# Patient Record
Sex: Female | Born: 1940 | Hispanic: No | Smoking: Current every day smoker
Health system: Southern US, Community
[De-identification: ages and names within clinical notes are randomized; demographics above are authoritative.]

## PROBLEM LIST (undated history)

## (undated) DIAGNOSIS — F411 Generalized anxiety disorder: Secondary | ICD-10-CM

## (undated) DIAGNOSIS — E785 Hyperlipidemia, unspecified: Secondary | ICD-10-CM

## (undated) DIAGNOSIS — F32A Depression, unspecified: Secondary | ICD-10-CM

## (undated) DIAGNOSIS — E079 Disorder of thyroid, unspecified: Secondary | ICD-10-CM

## (undated) DIAGNOSIS — F329 Major depressive disorder, single episode, unspecified: Secondary | ICD-10-CM

## (undated) DIAGNOSIS — I10 Essential (primary) hypertension: Secondary | ICD-10-CM

## (undated) HISTORY — DX: Essential (primary) hypertension: I10

## (undated) HISTORY — DX: Generalized anxiety disorder: F41.1

## (undated) HISTORY — DX: Disorder of thyroid, unspecified: E07.9

## (undated) HISTORY — DX: Hyperlipidemia, unspecified: E78.5

## (undated) HISTORY — DX: Major depressive disorder, single episode, unspecified: F32.9

## (undated) HISTORY — DX: Depression, unspecified: F32.A

---

## 1968-04-08 HISTORY — PX: EYE SURGERY: SHX253

## 2006-05-28 ENCOUNTER — Other Ambulatory Visit: Admission: RE | Admit: 2006-05-28 | Discharge: 2006-05-28 | Payer: Self-pay | Admitting: Family Medicine

## 2012-02-26 LAB — BASIC METABOLIC PANEL
BUN: 11 mg/dL (ref 4–21)
Creatinine: 0.8 mg/dL (ref 0.5–1.1)
Glucose: 94 mg/dL
Potassium: 3.9 mmol/L (ref 3.4–5.3)
Sodium: 140 mmol/L (ref 137–147)

## 2012-02-26 LAB — HEPATIC FUNCTION PANEL
ALT: 10 U/L (ref 7–35)
AST: 15 U/L (ref 13–35)
Bilirubin, Total: 0.7 mg/dL

## 2012-02-26 LAB — TSH: TSH: 1.81 u[IU]/mL (ref 0.41–5.90)

## 2012-06-16 ENCOUNTER — Other Ambulatory Visit: Payer: Self-pay | Admitting: Nurse Practitioner

## 2012-06-24 ENCOUNTER — Ambulatory Visit: Payer: Medicare Other

## 2012-06-24 ENCOUNTER — Ambulatory Visit (INDEPENDENT_AMBULATORY_CARE_PROVIDER_SITE_OTHER): Payer: Medicare Other | Admitting: Pharmacist

## 2012-06-24 ENCOUNTER — Encounter: Payer: Self-pay | Admitting: Pharmacist

## 2012-06-24 VITALS — Ht 64.0 in | Wt 150.0 lb

## 2012-06-24 DIAGNOSIS — M810 Age-related osteoporosis without current pathological fracture: Secondary | ICD-10-CM

## 2012-06-24 MED ORDER — ALENDRONATE SODIUM 70 MG PO TABS: 70.0000 mg | ORAL_TABLET | ORAL | Status: DC

## 2012-06-24 NOTE — Patient Instructions (Signed)

## 2012-06-24 NOTE — Progress Notes (Signed)
[facility] Current Height: Height: 5\' 4"  (162.6 cm)      Max Lifetime Height:  5'6" Current Weight: Weight: 150 lb (68.04 kg)     Max Lifetime Weight:  168 lbs  Ethnicity: Not Hispanic or Latino [1] BP:       HR:         HPI: Does pt already have a diagnosis of:  Osteopenia?  No Osteoporosis?  No  Back Pain?  Yes       Kyphosis?  Yes Prior fracture?  No Med(s) for Osteoporosis/Osteopenia:  Calcium 1200mg  daily, vitamin D 1000iu daily Med(s) previously tried for Osteoporosis/Osteopenia:  none                                                             PMH: Age at menopause:  72yo Hysterectomy?  No Oophorectomy?  No HRT? No Steroid Use?  No Thyroid med?  Yes History of cancer?  No History of digestive disorders (ie Crohn's)?  No Current or previous eating disorders?  No Last Vitamin D Result:  28 (09/2011) Last GFR Result:  68 (02/2012)   FH/SH: Family history of osteoporosis? Yes, niece Parent with history of hip fracture?  Yes, father in his 33's Family history of breast cancer?  Yes, niece Exercise?  Yes occassionaly Caffeine?  Yes, coffee 1 cup per day Smoking?  Yes   Alcohol?  No    Calcium Assessment Calcium Intake  # of servings/day  Calcium mg  Milk (8 oz) 0.5  x  300  = 150  Yogurt (4 oz) 0.5 x  200 = 100  Cheese (1 oz) 0 x  200 = 0  Other Calcium sources     Ca supplement 1200/day = 1200   Estimated calcium intake per day 1450mg     DEXA Results Date of Test T-Score for AP Spine L1-L4 T-Score for Total Left Hip T-Score for Total Right Hip  06/24/2012 -2.8 -2.7 -3.0                  FRAX 10 year estimate:  Assessment: osteoporosis  Recommendations: Start alendronate 70mg  1 tablet by mouth weekly.  Take on an empty stomach, with full glass of water.  Nothing to eat, no meds for 30 minutes after taking alendronate.  Do not lie down or recline for 30 minutes after taking alendronate recommend weight bearing exercise - 30 minutes at least 4  days per week.   Counseled and educated about fall risk and prevention Recheck DEXA:  2 years  Time spent counseling patient:  30 minutes

## 2012-09-21 ENCOUNTER — Other Ambulatory Visit: Payer: Self-pay | Admitting: *Deleted

## 2012-09-21 MED ORDER — LISINOPRIL 40 MG PO TABS: 40.0000 mg | ORAL_TABLET | Freq: Every day | ORAL | Status: DC

## 2012-09-21 MED ORDER — METOPROLOL SUCCINATE ER 100 MG PO TB24: 100.0000 mg | ORAL_TABLET | Freq: Every day | ORAL | Status: DC

## 2012-09-21 MED ORDER — LEVOTHYROXINE SODIUM 75 MCG PO TABS: 75.0000 ug | ORAL_TABLET | Freq: Every day | ORAL | Status: DC

## 2012-09-21 MED ORDER — HYDROCHLOROTHIAZIDE 25 MG PO TABS: 25.0000 mg | ORAL_TABLET | Freq: Every day | ORAL | Status: DC

## 2012-09-23 ENCOUNTER — Encounter: Payer: Self-pay | Admitting: Nurse Practitioner

## 2012-09-23 ENCOUNTER — Ambulatory Visit (INDEPENDENT_AMBULATORY_CARE_PROVIDER_SITE_OTHER): Payer: Medicare Other | Admitting: Nurse Practitioner

## 2012-09-23 VITALS — BP 154/88 | HR 63 | Temp 96.8°F | Ht 63.0 in | Wt 149.0 lb

## 2012-09-23 DIAGNOSIS — F411 Generalized anxiety disorder: Secondary | ICD-10-CM | POA: Insufficient documentation

## 2012-09-23 DIAGNOSIS — E039 Hypothyroidism, unspecified: Secondary | ICD-10-CM

## 2012-09-23 DIAGNOSIS — F329 Major depressive disorder, single episode, unspecified: Secondary | ICD-10-CM

## 2012-09-23 DIAGNOSIS — I10 Essential (primary) hypertension: Secondary | ICD-10-CM

## 2012-09-23 DIAGNOSIS — E785 Hyperlipidemia, unspecified: Secondary | ICD-10-CM

## 2012-09-23 LAB — COMPLETE METABOLIC PANEL WITH GFR
ALT: 10 U/L (ref 0–35)
AST: 18 U/L (ref 0–37)
Albumin: 4 g/dL (ref 3.5–5.2)
Alkaline Phosphatase: 86 U/L (ref 39–117)
Calcium: 9.1 mg/dL (ref 8.4–10.5)
Chloride: 103 mEq/L (ref 96–112)
Potassium: 4.4 mEq/L (ref 3.5–5.3)
Sodium: 140 mEq/L (ref 135–145)
Total Protein: 6.9 g/dL (ref 6.0–8.3)

## 2012-09-23 LAB — THYROID PANEL WITH TSH
Free Thyroxine Index: 3.9 (ref 1.0–3.9)
T3 Uptake: 33.4 % (ref 22.5–37.0)
TSH: 1.461 u[IU]/mL (ref 0.350–4.500)

## 2012-09-23 MED ORDER — METOPROLOL SUCCINATE ER 100 MG PO TB24: 100.0000 mg | ORAL_TABLET | Freq: Every day | ORAL | Status: DC

## 2012-09-23 MED ORDER — CLONAZEPAM 0.5 MG PO TABS: 0.5000 mg | ORAL_TABLET | Freq: Two times a day (BID) | ORAL | Status: DC | PRN

## 2012-09-23 MED ORDER — LISINOPRIL 40 MG PO TABS: 40.0000 mg | ORAL_TABLET | Freq: Every day | ORAL | Status: DC

## 2012-09-23 MED ORDER — HYDROCHLOROTHIAZIDE 25 MG PO TABS: 25.0000 mg | ORAL_TABLET | Freq: Every day | ORAL | Status: DC

## 2012-09-23 MED ORDER — LEVOTHYROXINE SODIUM 75 MCG PO TABS: 75.0000 ug | ORAL_TABLET | Freq: Every day | ORAL | Status: DC

## 2012-09-23 NOTE — Patient Instructions (Signed)
Stress Management Stress is a state of physical or mental tension that often results from changes in your life or normal routine. Some common causes of stress are:  Death of a loved one.  Injuries or severe illnesses.  Getting fired or changing jobs.  Moving into a new home. Other causes may be:  Sexual problems.  Business or financial losses.  Taking on a large debt.  Regular conflict with someone at home or at work.  Constant tiredness from lack of sleep. It is not just bad things that are stressful. It may be stressful to:  Win the lottery.  Get married.  Buy a new car. The amount of stress that can be easily tolerated varies from person to person. Changes generally cause stress, regardless of the types of change. Too much stress can affect your health. It may lead to physical or emotional problems. Too little stress (boredom) may also become stressful. SUGGESTIONS TO REDUCE STRESS:  Talk things over with your family and friends. It often is helpful to share your concerns and worries. If you feel your problem is serious, you may want to get help from a professional counselor.  Consider your problems one at a time instead of lumping them all together. Trying to take care of everything at once may seem impossible. List all the things you need to do and then start with the most important one. Set a goal to accomplish 2 or 3 things each day. If you expect to do too many in a single day you will naturally fail, causing you to feel even more stressed.  Do not use alcohol or drugs to relieve stress. Although you may feel better for a short time, they do not remove the problems that caused the stress. They can also be habit forming.  Exercise regularly - at least 3 times per week. Physical exercise can help to relieve that "uptight" feeling and will relax you.  The shortest distance between despair and hope is often a good night's sleep.  Go to bed and get up on time allowing  yourself time for appointments without being rushed.  Take a short "time-out" period from any stressful situation that occurs during the day. Close your eyes and take some deep breaths. Starting with the muscles in your face, tense them, hold it for a few seconds, then relax. Repeat this with the muscles in your neck, shoulders, hand, stomach, back and legs.  Take good care of yourself. Eat a balanced diet and get plenty of rest.  Schedule time for having fun. Take a break from your daily routine to relax. HOME CARE INSTRUCTIONS   Call if you feel overwhelmed by your problems and feel you can no longer manage them on your own.  Return immediately if you feel like hurting yourself or someone else. Document Released: 09/18/2000 Document Revised: 06/17/2011 Document Reviewed: 05/11/2007 [employer] Patient Information 2014 [employer].  

## 2012-09-23 NOTE — Progress Notes (Signed)
Subjective:    Patient ID: Denise Stewart, female    DOB: [birth year], 72 y.o.   MRN: [id number]  Hyperlipidemia This is a chronic problem. The current episode started more than 1 year ago. The problem is uncontrolled. Recent lipid tests were reviewed and are high. Exacerbating diseases include hypothyroidism. There are no known factors aggravating her hyperlipidemia. Pertinent negatives include no chest pain, leg pain or myalgias. Current antihyperlipidemic treatment includes diet change (doesn't want statin- myalgia). The current treatment provides moderate improvement of lipids. Compliance problems include adherence to diet and adherence to exercise.  Risk factors for coronary artery disease include hypertension.  Hypertension This is a chronic problem. The current episode started more than 1 year ago. The problem is unchanged. The problem is uncontrolled (regular at home). Pertinent negatives include no chest pain or palpitations. There are no associated agents to hypertension. Risk factors for coronary artery disease include dyslipidemia, post-menopausal state and sedentary lifestyle. Past treatments include diuretics, ACE inhibitors and beta blockers. The current treatment provides moderate improvement. Compliance problems include diet and exercise.  Hypertensive end-organ damage includes a thyroid problem.  Thyroid Problem Presents for follow-up (hypothyroidism) visit. Symptoms include anxiety and depressed mood (occassional). Patient reports no constipation, heat intolerance, palpitations, visual change, weight gain or weight loss. The symptoms have been stable. Her past medical history is significant for hyperlipidemia.  GAD Klonopin BID- Keeps her calm Depression Was on celexa in the past but said had bad reaction to it- said that se feels depressed but is afraiad to take anything- Will just try to deal with it on her own.   Review of Systems  Constitutional: Negative for weight loss and  weight gain.  Cardiovascular: Negative for chest pain and palpitations.  Gastrointestinal: Negative for constipation.  Endocrine: Negative for heat intolerance.  Musculoskeletal: Negative for myalgias.       Objective:   Physical Exam  Constitutional: She is oriented to person, place, and time. She appears well-developed and well-nourished.  HENT:  Nose: Nose normal.  Mouth/Throat: Oropharynx is clear and moist.  Eyes: EOM are normal.  Neck: Trachea normal, normal range of motion and full passive range of motion without pain. Neck supple. No JVD present. Carotid bruit is not present. No thyromegaly present.  Cardiovascular: Normal rate, regular rhythm, normal heart sounds and intact distal pulses.  Exam reveals no gallop and no friction rub.   No murmur heard. Pulmonary/Chest: Effort normal and breath sounds normal.  Abdominal: Soft. Bowel sounds are normal. She exhibits no distension and no mass. There is no tenderness.  Musculoskeletal: Normal range of motion.  Lymphadenopathy:    She has no cervical adenopathy.  Neurological: She is alert and oriented to person, place, and time. She has normal reflexes.  Skin: Skin is warm and dry.  Psychiatric: She has a normal mood and affect. Her behavior is normal. Judgment and thought content normal.    See depression screening BP 154/88  Pulse 63  Temp(Src) 96.8 F (36 C) (Oral)  Ht 5\' 3"  (1.6 m)  Wt 149 lb (67.586 kg)  BMI 26.4 kg/m2       Assessment & Plan:   1. Hypertension   2. Hyperlipidemia   3. Hypothyroidism   4. Depression   5. GAD (generalized anxiety disorder)    Orders Placed This Encounter  Procedures  . COMPLETE METABOLIC PANEL WITH GFR  . NMR Lipoprofile with Lipids  . Thyroid Panel With TSH   Meds ordered this encounter  Medications  .  hydrochlorothiazide (HYDRODIURIL) 25 MG tablet    Sig: Take 1 tablet (25 mg total) by mouth daily.    Dispense:  30 tablet    Refill:  5    Order Specific Question:   Supervising Provider    Answer:  Ernestina Penna [[id number]]  . levothyroxine (SYNTHROID, LEVOTHROID) 75 MCG tablet    Sig: Take 1 tablet (75 mcg total) by mouth daily.    Dispense:  30 tablet    Refill:  5    Order Specific Question:  Supervising Provider    Answer:  Ernestina Penna [[id number]]  . lisinopril (PRINIVIL,ZESTRIL) 40 MG tablet    Sig: Take 1 tablet (40 mg total) by mouth daily.    Dispense:  30 tablet    Refill:  5    Order Specific Question:  Supervising Provider    Answer:  Ernestina Penna [[id number]]  . metoprolol succinate (TOPROL-XL) 100 MG 24 hr tablet    Sig: Take 1 tablet (100 mg total) by mouth daily. Take with or immediately following a meal.    Dispense:  30 tablet    Refill:  5    Order Specific Question:  Supervising Provider    Answer:  Ernestina Penna [[id number]]  . clonazePAM (KLONOPIN) 0.5 MG tablet    Sig: Take 1 tablet (0.5 mg total) by mouth 2 (two) times daily as needed for anxiety.    Dispense:  60 tablet    Refill:  1    Do not fill till 10/07/12    Order Specific Question:  Supervising Provider    Answer:  Ernestina Penna [[id number]]   Continue all meds Labs pensing Diet and exercise encouraged  Mary-Margaret Daphine Deutscher, FNP

## 2012-09-24 LAB — NMR LIPOPROFILE WITH LIPIDS
Cholesterol, Total: 178 mg/dL (ref <200)
HDL Particle Number: 29.4 umol/L — ABNORMAL LOW (ref >=30.5)
Large VLDL-P: <0.8 nmol/L (ref <=2.7)
Small LDL Particle Number: 1172 nmol/L — ABNORMAL HIGH (ref <=527)
Triglycerides: 150 mg/dL — ABNORMAL HIGH (ref <150)

## 2012-09-25 ENCOUNTER — Telehealth: Payer: Self-pay | Admitting: Nurse Practitioner

## 2012-12-10 ENCOUNTER — Other Ambulatory Visit: Payer: Self-pay

## 2012-12-10 DIAGNOSIS — F411 Generalized anxiety disorder: Secondary | ICD-10-CM

## 2012-12-10 NOTE — Telephone Encounter (Signed)
Last seen 09/23/12  MMM   If approved route to nurse to phone in

## 2012-12-16 MED ORDER — CLONAZEPAM 0.5 MG PO TABS: 0.5000 mg | ORAL_TABLET | Freq: Two times a day (BID) | ORAL | Status: DC | PRN

## 2013-03-01 ENCOUNTER — Encounter: Payer: Self-pay | Admitting: Nurse Practitioner

## 2013-03-01 ENCOUNTER — Encounter (INDEPENDENT_AMBULATORY_CARE_PROVIDER_SITE_OTHER): Payer: Self-pay

## 2013-03-01 ENCOUNTER — Ambulatory Visit (INDEPENDENT_AMBULATORY_CARE_PROVIDER_SITE_OTHER): Payer: Medicare Other | Admitting: Nurse Practitioner

## 2013-03-01 VITALS — BP 151/84 | HR 79 | Temp 97.6°F | Ht 63.0 in | Wt 154.0 lb

## 2013-03-01 DIAGNOSIS — I10 Essential (primary) hypertension: Secondary | ICD-10-CM

## 2013-03-01 DIAGNOSIS — E039 Hypothyroidism, unspecified: Secondary | ICD-10-CM

## 2013-03-01 DIAGNOSIS — F411 Generalized anxiety disorder: Secondary | ICD-10-CM

## 2013-03-01 DIAGNOSIS — F329 Major depressive disorder, single episode, unspecified: Secondary | ICD-10-CM

## 2013-03-01 DIAGNOSIS — F3289 Other specified depressive episodes: Secondary | ICD-10-CM

## 2013-03-01 DIAGNOSIS — E785 Hyperlipidemia, unspecified: Secondary | ICD-10-CM

## 2013-03-01 MED ORDER — CLONAZEPAM 0.5 MG PO TABS: 0.5000 mg | ORAL_TABLET | Freq: Two times a day (BID) | ORAL | Status: DC | PRN

## 2013-03-01 MED ORDER — LISINOPRIL 40 MG PO TABS: 40.0000 mg | ORAL_TABLET | Freq: Every day | ORAL | Status: DC

## 2013-03-01 MED ORDER — LEVOTHYROXINE SODIUM 75 MCG PO TABS: 75.0000 ug | ORAL_TABLET | Freq: Every day | ORAL | Status: DC

## 2013-03-01 MED ORDER — METOPROLOL SUCCINATE ER 100 MG PO TB24: 100.0000 mg | ORAL_TABLET | Freq: Every day | ORAL | Status: DC

## 2013-03-01 MED ORDER — HYDROCHLOROTHIAZIDE 25 MG PO TABS: 25.0000 mg | ORAL_TABLET | Freq: Every day | ORAL | Status: DC

## 2013-03-01 NOTE — Patient Instructions (Signed)

## 2013-03-01 NOTE — Progress Notes (Signed)
Subjective:    Patient ID: Denise Stewart, female    DOB: 05-14-1940, 72 y.o.   MRN: [id number]  Hyperlipidemia This is a chronic problem. The current episode started more than 1 year ago. The problem is uncontrolled. Recent lipid tests were reviewed and are high. Exacerbating diseases include hypothyroidism. There are no known factors aggravating her hyperlipidemia. Pertinent negatives include no chest pain, leg pain or myalgias. Current antihyperlipidemic treatment includes diet change (doesn't want statin- myalgia). The current treatment provides moderate improvement of lipids. Compliance problems include adherence to diet and adherence to exercise.  Risk factors for coronary artery disease include hypertension.  Hypertension This is a chronic problem. The current episode started more than 1 year ago. The problem is unchanged. The problem is uncontrolled (regular at home). Pertinent negatives include no chest pain or palpitations. There are no associated agents to hypertension. Risk factors for coronary artery disease include dyslipidemia, post-menopausal state and sedentary lifestyle. Past treatments include diuretics, ACE inhibitors and beta blockers. The current treatment provides moderate improvement. Compliance problems include diet and exercise.  Hypertensive end-organ damage includes a thyroid problem.  Thyroid Problem Presents for follow-up (hypothyroidism) visit. Symptoms include anxiety and depressed mood (occassional). Patient reports no constipation, heat intolerance, palpitations, visual change, weight gain or weight loss. The symptoms have been stable. Her past medical history is significant for hyperlipidemia.  GAD Klonopin BID- Keeps her calm Depression Was on celexa in the past but said had bad reaction to it- said that se feels depressed but is afraiad to take anything- Will just try to deal with it on her own.   Review of Systems  Constitutional: Negative for weight loss and  weight gain.  Cardiovascular: Negative for chest pain and palpitations.  Gastrointestinal: Negative for constipation.  Endocrine: Negative for heat intolerance.  Musculoskeletal: Negative for myalgias.       Objective:   Physical Exam  Constitutional: She is oriented to person, place, and time. She appears well-developed and well-nourished.  HENT:  Nose: Nose normal.  Mouth/Throat: Oropharynx is clear and moist.  Eyes: EOM are normal.  Neck: Trachea normal, normal range of motion and full passive range of motion without pain. Neck supple. No JVD present. Carotid bruit is not present. No thyromegaly present.  Cardiovascular: Normal rate, regular rhythm, normal heart sounds and intact distal pulses.  Exam reveals no gallop and no friction rub.   No murmur heard. Pulmonary/Chest: Effort normal and breath sounds normal.  Abdominal: Soft. Bowel sounds are normal. She exhibits no distension and no mass. There is no tenderness.  Musculoskeletal: Normal range of motion.  Lymphadenopathy:    She has no cervical adenopathy.  Neurological: She is alert and oriented to person, place, and time. She has normal reflexes.  Skin: Skin is warm and dry.  Psychiatric: She has a normal mood and affect. Her behavior is normal. Judgment and thought content normal.    See depression screening BP 151/84  Pulse 79  Temp(Src) 97.6 F (36.4 C) (Oral)  Ht 5\' 3"  (1.6 m)  Wt 154 lb (69.854 kg)  BMI 27.29 kg/m2       Assessment & Plan:   1. Hypothyroidism   2. Hypertension   3. Hyperlipidemia   4. GAD (generalized anxiety disorder)   5. Depression    Orders Placed This Encounter  Procedures  . CMP14+EGFR  . NMR, lipoprofile  . Thyroid Panel With TSH   Meds ordered this encounter  Medications  . clonazePAM (KLONOPIN) 0.5 MG tablet  Sig: Take 1 tablet (0.5 mg total) by mouth 2 (two) times daily as needed for anxiety.    Dispense:  60 tablet    Refill:  1    Order Specific Question:   Supervising Provider    Answer:  Ernestina Penna [[id number]]  . hydrochlorothiazide (HYDRODIURIL) 25 MG tablet    Sig: Take 1 tablet (25 mg total) by mouth daily.    Dispense:  30 tablet    Refill:  5    Order Specific Question:  Supervising Provider    Answer:  Ernestina Penna [[id number]]  . levothyroxine (SYNTHROID, LEVOTHROID) 75 MCG tablet    Sig: Take 1 tablet (75 mcg total) by mouth daily.    Dispense:  30 tablet    Refill:  5    Order Specific Question:  Supervising Provider    Answer:  Ernestina Penna [[id number]]  . lisinopril (PRINIVIL,ZESTRIL) 40 MG tablet    Sig: Take 1 tablet (40 mg total) by mouth daily.    Dispense:  30 tablet    Refill:  5    Order Specific Question:  Supervising Provider    Answer:  Ernestina Penna [[id number]]  . metoprolol succinate (TOPROL-XL) 100 MG 24 hr tablet    Sig: Take 1 tablet (100 mg total) by mouth daily. Take with or immediately following a meal.    Dispense:  30 tablet    Refill:  5    Order Specific Question:  Supervising Provider    Answer:  Deborra Medina    Continue all meds Labs pending Diet and exercise encouraged Health maintenance reviewed Follow up in 6 months  Mary-Margaret Daphine Deutscher, FNP

## 2013-08-13 ENCOUNTER — Ambulatory Visit (INDEPENDENT_AMBULATORY_CARE_PROVIDER_SITE_OTHER): Payer: Medicare Other | Admitting: Nurse Practitioner

## 2013-08-13 ENCOUNTER — Encounter: Payer: Self-pay | Admitting: Nurse Practitioner

## 2013-08-13 VITALS — BP 159/83 | HR 75 | Temp 97.8°F | Ht 63.0 in | Wt 156.0 lb

## 2013-08-13 DIAGNOSIS — F411 Generalized anxiety disorder: Secondary | ICD-10-CM

## 2013-08-13 DIAGNOSIS — E039 Hypothyroidism, unspecified: Secondary | ICD-10-CM

## 2013-08-13 DIAGNOSIS — E785 Hyperlipidemia, unspecified: Secondary | ICD-10-CM

## 2013-08-13 DIAGNOSIS — I10 Essential (primary) hypertension: Secondary | ICD-10-CM

## 2013-08-13 DIAGNOSIS — F3289 Other specified depressive episodes: Secondary | ICD-10-CM

## 2013-08-13 DIAGNOSIS — F32A Depression, unspecified: Secondary | ICD-10-CM

## 2013-08-13 DIAGNOSIS — F329 Major depressive disorder, single episode, unspecified: Secondary | ICD-10-CM

## 2013-08-13 MED ORDER — HYDROCHLOROTHIAZIDE 25 MG PO TABS: 25.0000 mg | ORAL_TABLET | Freq: Every day | ORAL | Status: DC

## 2013-08-13 MED ORDER — CLONAZEPAM 0.5 MG PO TABS: 0.5000 mg | ORAL_TABLET | Freq: Two times a day (BID) | ORAL | Status: DC | PRN

## 2013-08-13 MED ORDER — LISINOPRIL 40 MG PO TABS: 40.0000 mg | ORAL_TABLET | Freq: Every day | ORAL | Status: DC

## 2013-08-13 MED ORDER — LEVOTHYROXINE SODIUM 75 MCG PO TABS: 75.0000 ug | ORAL_TABLET | Freq: Every day | ORAL | Status: DC

## 2013-08-13 MED ORDER — METOPROLOL SUCCINATE ER 100 MG PO TB24: 100.0000 mg | ORAL_TABLET | Freq: Every day | ORAL | Status: DC

## 2013-08-13 NOTE — Patient Instructions (Signed)

## 2013-08-13 NOTE — Progress Notes (Signed)
Subjective:    Patient ID: Denise Stewart, female    DOB: May 07, 1940, 73 y.o.   MRN: [id number]  Patient here today for follow up of chronic medical problems. Only compliant is nerves are getting worse.  Hyperlipidemia This is a chronic problem. The current episode started more than 1 year ago. The problem is uncontrolled. Recent lipid tests were reviewed and are high. Exacerbating diseases include hypothyroidism. There are no known factors aggravating her hyperlipidemia. Pertinent negatives include no chest pain, leg pain or myalgias. Current antihyperlipidemic treatment includes diet change (doesn't want statin- myalgia). The current treatment provides moderate improvement of lipids. Compliance problems include adherence to diet and adherence to exercise.  Risk factors for coronary artery disease include hypertension.  Hypertension This is a chronic problem. The current episode started more than 1 year ago. The problem is unchanged. The problem is uncontrolled (regular at home). Pertinent negatives include no chest pain or palpitations. There are no associated agents to hypertension. Risk factors for coronary artery disease include dyslipidemia, post-menopausal state and sedentary lifestyle. Past treatments include diuretics, ACE inhibitors and beta blockers. The current treatment provides moderate improvement. Compliance problems include diet and exercise.  Hypertensive end-organ damage includes a thyroid problem.  Thyroid Problem Presents for follow-up (hypothyroidism) visit. Symptoms include anxiety and depressed mood (occassional). Patient reports no constipation, heat intolerance, palpitations, visual change, weight gain or weight loss. The symptoms have been stable. Her past medical history is significant for hyperlipidemia.  GAD Klonopin BID- Keeps her calm only taking 1/2 BID and thinks she needs to increase to whole one Depression Was on celexa in the past but said had bad reaction to it-  said that se feels depressed but is afraiad to take anything- Will just try to deal with it on her own.   Review of Systems  Constitutional: Negative for weight loss and weight gain.  Cardiovascular: Negative for chest pain and palpitations.  Gastrointestinal: Negative for constipation.  Endocrine: Negative for heat intolerance.  Musculoskeletal: Negative for myalgias.       Objective:   Physical Exam  Constitutional: She is oriented to person, place, and time. She appears well-developed and well-nourished.  HENT:  Nose: Nose normal.  Mouth/Throat: Oropharynx is clear and moist.  Eyes: EOM are normal.  Neck: Trachea normal, normal range of motion and full passive range of motion without pain. Neck supple. No JVD present. Carotid bruit is not present. No thyromegaly present.  Cardiovascular: Normal rate, regular rhythm, normal heart sounds and intact distal pulses.  Exam reveals no gallop and no friction rub.   No murmur heard. Pulmonary/Chest: Effort normal and breath sounds normal.  Abdominal: Soft. Bowel sounds are normal. She exhibits no distension and no mass. There is no tenderness.  Musculoskeletal: Normal range of motion.  Lymphadenopathy:    She has no cervical adenopathy.  Neurological: She is alert and oriented to person, place, and time. She has normal reflexes.  Skin: Skin is warm and dry.  Psychiatric: She has a normal mood and affect. Her behavior is normal. Judgment and thought content normal.    See depression screening BP 159/83  Pulse 75  Temp(Src) 97.8 F (36.6 C) (Oral)  Ht 5' 3"  (1.6 m)  Wt 156 lb (70.761 kg)  BMI 27.64 kg/m2       Assessment & Plan:   1. Hypothyroidism   2. Hypertension   3. Hyperlipidemia   4. GAD (generalized anxiety disorder)   5. Depression    Orders Placed This Encounter  Procedures  . CMP14+EGFR  . NMR, lipoprofile  . Thyroid Panel With TSH   Meds ordered this encounter  Medications  . aspirin 81 MG tablet     Sig: Take 81 mg by mouth daily.  . clonazePAM (KLONOPIN) 0.5 MG tablet    Sig: Take 1 tablet (0.5 mg total) by mouth 2 (two) times daily as needed for anxiety.    Dispense:  60 tablet    Refill:  1    Order Specific Question:  Supervising Provider    Answer:  Chipper Herb [[id number]]  . hydrochlorothiazide (HYDRODIURIL) 25 MG tablet    Sig: Take 1 tablet (25 mg total) by mouth daily.    Dispense:  30 tablet    Refill:  5    Order Specific Question:  Supervising Provider    Answer:  Chipper Herb [[id number]]  . lisinopril (PRINIVIL,ZESTRIL) 40 MG tablet    Sig: Take 1 tablet (40 mg total) by mouth daily.    Dispense:  30 tablet    Refill:  5    Order Specific Question:  Supervising Provider    Answer:  Chipper Herb [[id number]]  . metoprolol succinate (TOPROL-XL) 100 MG 24 hr tablet    Sig: Take 1 tablet (100 mg total) by mouth daily. Take with or immediately following a meal.    Dispense:  30 tablet    Refill:  5    Order Specific Question:  Supervising Provider    Answer:  Chipper Herb [[id number]]  . levothyroxine (SYNTHROID, LEVOTHROID) 75 MCG tablet    Sig: Take 1 tablet (75 mcg total) by mouth daily.    Dispense:  30 tablet    Refill:  5    Order Specific Question:  Supervising Provider    Answer:  Chipper Herb [[id number]]    Labs pending Health maintenance reviewed- refuses most of health maintenance Diet and exercise encouraged Continue all meds Follow up  In 3 months   [place], FNP

## 2013-08-14 LAB — CMP14+EGFR
A/G RATIO: 1.7 (ref 1.1–2.5)
ALK PHOS: 94 IU/L (ref 39–117)
ALT: 11 IU/L (ref 0–32)
AST: 17 IU/L (ref 0–40)
Albumin: 4.3 g/dL (ref 3.5–4.8)
BILIRUBIN TOTAL: 0.6 mg/dL (ref 0.0–1.2)
BUN/Creatinine Ratio: 13 (ref 11–26)
BUN: 13 mg/dL (ref 8–27)
CALCIUM: 9.2 mg/dL (ref 8.7–10.3)
CO2: 25 mmol/L (ref 18–29)
Chloride: 100 mmol/L (ref 97–108)
Creatinine, Ser: 0.97 mg/dL (ref 0.57–1.00)
GFR calc Af Amer: 67 mL/min/{1.73_m2} (ref >59)
GFR, EST NON AFRICAN AMERICAN: 58 mL/min/{1.73_m2} — AB (ref >59)
Globulin, Total: 2.5 g/dL (ref 1.5–4.5)
Glucose: 101 mg/dL — ABNORMAL HIGH (ref 65–99)
POTASSIUM: 4.1 mmol/L (ref 3.5–5.2)
SODIUM: 142 mmol/L (ref 134–144)
Total Protein: 6.8 g/dL (ref 6.0–8.5)

## 2013-08-14 LAB — NMR, LIPOPROFILE
Cholesterol: 184 mg/dL (ref <200)
HDL CHOLESTEROL BY NMR: 39 mg/dL — AB (ref >=40)
HDL Particle Number: 29.4 umol/L — ABNORMAL LOW (ref >=30.5)
LDL Particle Number: 1492 nmol/L — ABNORMAL HIGH (ref <1000)
LDL SIZE: 20.4 nm (ref >20.5)
LDLC SERPL CALC-MCNC: 112 mg/dL — ABNORMAL HIGH (ref <100)
LP-IR Score: 55 — ABNORMAL HIGH (ref <=45)
SMALL LDL PARTICLE NUMBER: 943 nmol/L — AB (ref <=527)
Triglycerides by NMR: 164 mg/dL — ABNORMAL HIGH (ref <150)

## 2013-08-14 LAB — THYROID PANEL WITH TSH
Free Thyroxine Index: 2.8 (ref 1.2–4.9)
T3 Uptake Ratio: 26 % (ref 24–39)
T4, Total: 10.8 ug/dL (ref 4.5–12.0)
TSH: 1.35 u[IU]/mL (ref 0.450–4.500)

## 2014-02-07 ENCOUNTER — Other Ambulatory Visit: Payer: Self-pay | Admitting: Nurse Practitioner

## 2014-02-08 NOTE — Telephone Encounter (Signed)
Please call in clonazepam with 0 refills no more refills without being seen

## 2014-02-08 NOTE — Telephone Encounter (Signed)
rx called into pharmacy

## 2014-02-08 NOTE — Telephone Encounter (Signed)
Patient last seen in office on 08-13-13. Rx last filled on 12-02-13 for #[id number]. Please advise.

## 2014-03-08 ENCOUNTER — Other Ambulatory Visit: Payer: Self-pay | Admitting: Nurse Practitioner

## 2014-03-08 NOTE — Telephone Encounter (Signed)
Patient has follow up appointment 03/18/14.

## 2014-03-18 ENCOUNTER — Ambulatory Visit (INDEPENDENT_AMBULATORY_CARE_PROVIDER_SITE_OTHER): Payer: Medicare Other | Admitting: Nurse Practitioner

## 2014-03-18 ENCOUNTER — Encounter: Payer: Self-pay | Admitting: Nurse Practitioner

## 2014-03-18 VITALS — BP 146/81 | HR 74 | Temp 98.6°F | Ht 63.0 in | Wt 151.0 lb

## 2014-03-18 DIAGNOSIS — E785 Hyperlipidemia, unspecified: Secondary | ICD-10-CM

## 2014-03-18 DIAGNOSIS — F329 Major depressive disorder, single episode, unspecified: Secondary | ICD-10-CM

## 2014-03-18 DIAGNOSIS — E039 Hypothyroidism, unspecified: Secondary | ICD-10-CM

## 2014-03-18 DIAGNOSIS — F32A Depression, unspecified: Secondary | ICD-10-CM

## 2014-03-18 DIAGNOSIS — F411 Generalized anxiety disorder: Secondary | ICD-10-CM

## 2014-03-18 DIAGNOSIS — I10 Essential (primary) hypertension: Secondary | ICD-10-CM

## 2014-03-18 MED ORDER — LISINOPRIL 40 MG PO TABS: ORAL_TABLET | ORAL | Status: DC

## 2014-03-18 MED ORDER — CLONAZEPAM 0.5 MG PO TABS: ORAL_TABLET | ORAL | Status: DC

## 2014-03-18 MED ORDER — LEVOTHYROXINE SODIUM 75 MCG PO TABS
ORAL_TABLET | ORAL | Status: DC
Start: 2014-03-18 — End: 2014-08-29

## 2014-03-18 MED ORDER — HYDROCHLOROTHIAZIDE 25 MG PO TABS: ORAL_TABLET | ORAL | Status: DC

## 2014-03-18 MED ORDER — METOPROLOL SUCCINATE ER 100 MG PO TB24: 100.0000 mg | ORAL_TABLET | Freq: Every day | ORAL | Status: DC

## 2014-03-18 NOTE — Patient Instructions (Signed)

## 2014-03-18 NOTE — Progress Notes (Signed)
Subjective:    Patient ID: Denise Stewart, female    DOB: 05-14-1940, 73 y.o.   MRN: [id number]   Patient here today for follow up of chronic medical problems.   Hyperlipidemia This is a chronic problem. The current episode started more than 1 year ago. The problem is controlled. Recent lipid tests were reviewed and are normal. Factors aggravating her hyperlipidemia include thiazides. Pertinent negatives include no chest pain or myalgias. Current antihyperlipidemic treatment includes statins. The current treatment provides moderate improvement of lipids. Compliance problems include adherence to diet and adherence to exercise.  Risk factors for coronary artery disease include dyslipidemia, hypertension, obesity and post-menopausal.  Hypertension This is a chronic problem. The current episode started more than 1 year ago. The problem is controlled. Pertinent negatives include no chest pain or palpitations. Risk factors for coronary artery disease include dyslipidemia and post-menopausal state. Past treatments include ACE inhibitors and beta blockers. The current treatment provides moderate improvement. Compliance problems include diet and exercise.  Hypertensive end-organ damage includes a thyroid problem.  Thyroid Problem Visit type: hypothyroidism. Patient reports no constipation, heat intolerance, palpitations or visual change. The symptoms have been stable. Her past medical history is significant for hyperlipidemia.  GAD Klonopin BID- Keeps her calm Depression Was on celexa in the past but said had bad reaction to it- said that se feels depressed but is afraiad to take anything- Will just try to deal with it on her own.   Review of Systems  Cardiovascular: Negative for chest pain and palpitations.  Gastrointestinal: Negative for constipation.  Endocrine: Negative for heat intolerance.  Musculoskeletal: Negative for myalgias.       Objective:   Physical Exam  Constitutional: She is  oriented to person, place, and time. She appears well-developed and well-nourished.  HENT:  Nose: Nose normal.  Mouth/Throat: Oropharynx is clear and moist.  Eyes: EOM are normal.  Neck: Trachea normal, normal range of motion and full passive range of motion without pain. Neck supple. No JVD present. Carotid bruit is not present. No thyromegaly present.  Cardiovascular: Normal rate, regular rhythm, normal heart sounds and intact distal pulses.  Exam reveals no gallop and no friction rub.   No murmur heard. Pulmonary/Chest: Effort normal and breath sounds normal.  Abdominal: Soft. Bowel sounds are normal. She exhibits no distension and no mass. There is no tenderness.  Musculoskeletal: Normal range of motion.  Lymphadenopathy:    She has no cervical adenopathy.  Neurological: She is alert and oriented to person, place, and time. She has normal reflexes.  Skin: Skin is warm and dry.  Psychiatric: She has a normal mood and affect. Her behavior is normal. Judgment and thought content normal.    See depression screening BP 146/81 mmHg  Pulse 74  Temp(Src) 98.6 F (37 C) (Oral)  Ht 5' 3" (1.6 m)  Wt 151 lb (68.493 kg)  BMI 26.76 kg/m2       Assessment & Plan:   1. Essential hypertension Do not add salt to diet - hydrochlorothiazide (HYDRODIURIL) 25 MG tablet; TAKE ONE (1) TABLET EACH DAY  Dispense: 30 tablet; Refill: 5 - lisinopril (PRINIVIL,ZESTRIL) 40 MG tablet; TAKE ONE (1) TABLET EACH DAY  Dispense: 30 tablet; Refill: 5 - metoprolol succinate (TOPROL-XL) 100 MG 24 hr tablet; Take 1 tablet (100 mg total) by mouth daily. with food  Dispense: 30 tablet; Refill: 5 - CMP14+EGFR  2. Hypothyroidism, unspecified hypothyroidism type - levothyroxine (SYNTHROID, LEVOTHROID) 75 MCG tablet; TAKE ONE (1) TABLET EACH DAY  Dispense: 30 tablet; Refill: 5 - Thyroid Panel With TSH  3. Hyperlipidemia Low fat diet - NMR, lipoprofile  4. Depression Stress management  5. GAD (generalized  anxiety disorder) - clonazePAM (KLONOPIN) 0.5 MG tablet; TAKE ONE TABLET TWICE DAILY AS NEEDED  Dispense: 60 tablet; Refill: 5   Refuses ALL health maintenence Labs pending Health maintenance reviewed Diet and exercise encouraged Continue all meds Follow up  In 3 month   [place], FNP

## 2014-03-19 LAB — CMP14+EGFR
ALT: 11 IU/L (ref 0–32)
AST: 17 IU/L (ref 0–40)
Albumin/Globulin Ratio: 1.5 (ref 1.1–2.5)
Albumin: 4.3 g/dL (ref 3.5–4.8)
Alkaline Phosphatase: 107 IU/L (ref 39–117)
BUN/Creatinine Ratio: 13 (ref 11–26)
BUN: 14 mg/dL (ref 8–27)
CALCIUM: 9.4 mg/dL (ref 8.7–10.3)
CHLORIDE: 98 mmol/L (ref 97–108)
CO2: 27 mmol/L (ref 18–29)
Creatinine, Ser: 1.07 mg/dL — ABNORMAL HIGH (ref 0.57–1.00)
GFR calc Af Amer: 59 mL/min/{1.73_m2} — ABNORMAL LOW (ref >59)
GFR calc non Af Amer: 51 mL/min/{1.73_m2} — ABNORMAL LOW (ref >59)
Globulin, Total: 2.9 g/dL (ref 1.5–4.5)
Glucose: 99 mg/dL (ref 65–99)
Potassium: 4.1 mmol/L (ref 3.5–5.2)
SODIUM: 140 mmol/L (ref 134–144)
Total Bilirubin: 0.6 mg/dL (ref 0.0–1.2)
Total Protein: 7.2 g/dL (ref 6.0–8.5)

## 2014-03-19 LAB — THYROID PANEL WITH TSH
FREE THYROXINE INDEX: 2.6 (ref 1.2–4.9)
T3 Uptake Ratio: 25 % (ref 24–39)
T4 TOTAL: 10.4 ug/dL (ref 4.5–12.0)
TSH: 1.84 u[IU]/mL (ref 0.450–4.500)

## 2014-03-19 LAB — NMR, LIPOPROFILE
Cholesterol: 184 mg/dL (ref 100–199)
HDL CHOLESTEROL BY NMR: 39 mg/dL — AB (ref >39)
HDL Particle Number: 28.3 umol/L — ABNORMAL LOW (ref >=30.5)
LDL Particle Number: 1442 nmol/L — ABNORMAL HIGH (ref <1000)
LDL Size: 20.4 nm (ref >20.5)
LDL-C: 117 mg/dL — ABNORMAL HIGH (ref 0–99)
LP-IR SCORE: 65 — AB (ref <=45)
Small LDL Particle Number: 716 nmol/L — ABNORMAL HIGH (ref <=527)
Triglycerides by NMR: 141 mg/dL (ref 0–149)

## 2014-03-21 ENCOUNTER — Telehealth: Payer: Self-pay | Admitting: *Deleted

## 2014-03-21 NOTE — Telephone Encounter (Signed)
Patient aware of results She will consider taking medicine for cholesterol.

## 2014-03-21 NOTE — Telephone Encounter (Signed)
-----   [facility] Martin, FNP sent at 03/21/2014  2:16 PM EST ----- Kidney and liver function stable Cholesterol not looking good- really needs to be on a statin Thyroid panel normal Continue current meds- low fat diet and exercise and recheck in 3 months

## 2014-05-09 ENCOUNTER — Other Ambulatory Visit: Payer: Self-pay | Admitting: Nurse Practitioner

## 2014-05-10 NOTE — Telephone Encounter (Signed)
Please call in klonopin with 2 refills 

## 2014-05-10 NOTE — Telephone Encounter (Signed)
York SpanielSaid it was printed with 5 refills on 03/18/14?

## 2014-05-11 NOTE — Telephone Encounter (Signed)
Called into pharmacy

## 2014-08-29 ENCOUNTER — Ambulatory Visit (INDEPENDENT_AMBULATORY_CARE_PROVIDER_SITE_OTHER): Payer: Medicare Other | Admitting: Nurse Practitioner

## 2014-08-29 ENCOUNTER — Encounter: Payer: Self-pay | Admitting: Nurse Practitioner

## 2014-08-29 VITALS — BP 151/88 | HR 74 | Temp 97.4°F | Ht 63.0 in | Wt 150.8 lb

## 2014-08-29 DIAGNOSIS — Z23 Encounter for immunization: Secondary | ICD-10-CM

## 2014-08-29 DIAGNOSIS — I10 Essential (primary) hypertension: Secondary | ICD-10-CM

## 2014-08-29 DIAGNOSIS — E039 Hypothyroidism, unspecified: Secondary | ICD-10-CM

## 2014-08-29 DIAGNOSIS — F411 Generalized anxiety disorder: Secondary | ICD-10-CM

## 2014-08-29 DIAGNOSIS — F32A Depression, unspecified: Secondary | ICD-10-CM

## 2014-08-29 DIAGNOSIS — E785 Hyperlipidemia, unspecified: Secondary | ICD-10-CM | POA: Diagnosis not present

## 2014-08-29 DIAGNOSIS — F329 Major depressive disorder, single episode, unspecified: Secondary | ICD-10-CM | POA: Diagnosis not present

## 2014-08-29 MED ORDER — LEVOTHYROXINE SODIUM 75 MCG PO TABS: ORAL_TABLET | ORAL | Status: DC

## 2014-08-29 MED ORDER — HYDROCHLOROTHIAZIDE 25 MG PO TABS: ORAL_TABLET | ORAL | Status: DC

## 2014-08-29 MED ORDER — LISINOPRIL 40 MG PO TABS: ORAL_TABLET | ORAL | Status: DC

## 2014-08-29 MED ORDER — METOPROLOL SUCCINATE ER 100 MG PO TB24: 100.0000 mg | ORAL_TABLET | Freq: Every day | ORAL | Status: DC

## 2014-08-29 MED ORDER — CLONAZEPAM 0.5 MG PO TABS: ORAL_TABLET | ORAL | Status: DC

## 2014-08-29 NOTE — Patient Instructions (Signed)

## 2014-08-29 NOTE — Progress Notes (Signed)
Subjective:    Patient ID: Denise Stewart, female    DOB: 11-Feb-1941, 74 y.o.   MRN: [id number]   Patient here today for follow up of chronic medical problems.   Hyperlipidemia This is a chronic problem. The current episode started more than 1 year ago. The problem is controlled. Recent lipid tests were reviewed and are normal. Factors aggravating her hyperlipidemia include thiazides. Pertinent negatives include no chest pain or myalgias. Current antihyperlipidemic treatment includes statins. The current treatment provides moderate improvement of lipids. Compliance problems include adherence to diet and adherence to exercise.  Risk factors for coronary artery disease include dyslipidemia, hypertension, obesity and post-menopausal.  Hypertension This is a chronic problem. The current episode started more than 1 year ago. The problem is controlled. Pertinent negatives include no chest pain or palpitations. Risk factors for coronary artery disease include dyslipidemia and post-menopausal state. Past treatments include ACE inhibitors and beta blockers. The current treatment provides moderate improvement. Compliance problems include diet and exercise.  Hypertensive end-organ damage includes a thyroid problem.  Thyroid Problem Visit type: hypothyroidism. Patient reports no constipation, heat intolerance, palpitations or visual change. The symptoms have been stable. Her past medical history is significant for hyperlipidemia.  GAD Klonopin BID- Keeps her calm Depression Was on celexa in the past but said had bad reaction to it- said that se feels depressed but is afraiad to take anything- Will just try to deal with it on her own.   Review of Systems  Constitutional: Negative.   HENT: Negative.   Respiratory: Negative.   Cardiovascular: Negative.  Negative for chest pain and palpitations.  Gastrointestinal: Negative.  Negative for constipation.  Endocrine: Negative for heat intolerance.   Genitourinary: Negative.   Musculoskeletal: Negative for myalgias.  Neurological: Negative.   Psychiatric/Behavioral: Negative.   All other systems reviewed and are negative.      Objective:   Physical Exam  Constitutional: She is oriented to person, place, and time. She appears well-developed and well-nourished.  HENT:  Nose: Nose normal.  Mouth/Throat: Oropharynx is clear and moist.  Eyes: EOM are normal.  Neck: Trachea normal, normal range of motion and full passive range of motion without pain. Neck supple. No JVD present. Carotid bruit is not present. No thyromegaly present.  Cardiovascular: Normal rate, regular rhythm, normal heart sounds and intact distal pulses.  Exam reveals no gallop and no friction rub.   No murmur heard. Pulmonary/Chest: Effort normal and breath sounds normal.  Abdominal: Soft. Bowel sounds are normal. She exhibits no distension and no mass. There is no tenderness.  Musculoskeletal: Normal range of motion.  Lymphadenopathy:    She has no cervical adenopathy.  Neurological: She is alert and oriented to person, place, and time. She has normal reflexes.  Skin: Skin is warm and dry.  Psychiatric: She has a normal mood and affect. Her behavior is normal. Judgment and thought content normal.    See depression screening BP 151/88 mmHg  Pulse 74  Temp(Src) 97.4 F (36.3 C) (Oral)  Ht 5' 3"  (1.6 m)  Wt 150 lb 12.8 oz (68.402 kg)  BMI 26.72 kg/m2       Assessment & Plan:   1. Essential hypertension Do not add slat to diet - lisinopril (PRINIVIL,ZESTRIL) 40 MG tablet; TAKE ONE (1) TABLET EACH DAY  Dispense: 30 tablet; Refill: 5 - metoprolol succinate (TOPROL-XL) 100 MG 24 hr tablet; Take 1 tablet (100 mg total) by mouth daily. with food  Dispense: 30 tablet; Refill: 5 - hydrochlorothiazide (HYDRODIURIL)  25 MG tablet; TAKE ONE (1) TABLET EACH DAY  Dispense: 30 tablet; Refill: 5 - CMP14+EGFR  2. Hypothyroidism, unspecified hypothyroidism type -  levothyroxine (SYNTHROID, LEVOTHROID) 75 MCG tablet; TAKE ONE (1) TABLET EACH DAY  Dispense: 30 tablet; Refill: 5  3. Hyperlipidemia Low fat diet - NMR, lipoprofile  4. GAD (generalized anxiety disorder) **stress management* - clonazePAM (KLONOPIN) 0.5 MG tablet; TAKE ONE TABLET TWICE DAILY AS NEEDED  Dispense: 60 tablet; Refill: 2  5. Depression   prevnar 13 today Labs pending Health maintenance reviewed Diet and exercise encouraged Continue all meds Follow up  In 3 month   [place], FNP

## 2014-08-29 NOTE — Addendum Note (Signed)
Addended by: Tamera PuntWRAY, Siobahn Worsley S on: 08/29/2014 02:30 PM   Modules accepted: Orders

## 2014-08-30 LAB — CMP14+EGFR
ALT: 9 IU/L (ref 0–32)
AST: 17 IU/L (ref 0–40)
Albumin/Globulin Ratio: 1.7 (ref 1.1–2.5)
Albumin: 4.2 g/dL (ref 3.5–4.8)
Alkaline Phosphatase: 88 IU/L (ref 39–117)
BUN/Creatinine Ratio: 16 (ref 11–26)
BUN: 15 mg/dL (ref 8–27)
Bilirubin Total: 0.5 mg/dL (ref 0.0–1.2)
CALCIUM: 9.1 mg/dL (ref 8.7–10.3)
CHLORIDE: 100 mmol/L (ref 97–108)
CO2: 24 mmol/L (ref 18–29)
CREATININE: 0.96 mg/dL (ref 0.57–1.00)
GFR calc non Af Amer: 58 mL/min/{1.73_m2} — ABNORMAL LOW (ref >59)
GFR, EST AFRICAN AMERICAN: 67 mL/min/{1.73_m2} (ref >59)
Globulin, Total: 2.5 g/dL (ref 1.5–4.5)
Glucose: 91 mg/dL (ref 65–99)
POTASSIUM: 4.1 mmol/L (ref 3.5–5.2)
Sodium: 142 mmol/L (ref 134–144)
Total Protein: 6.7 g/dL (ref 6.0–8.5)

## 2014-08-30 LAB — NMR, LIPOPROFILE
Cholesterol: 168 mg/dL (ref 100–199)
HDL Cholesterol by NMR: 41 mg/dL (ref >39)
HDL Particle Number: 29 umol/L — ABNORMAL LOW (ref >=30.5)
LDL PARTICLE NUMBER: 1319 nmol/L — AB (ref <1000)
LDL SIZE: 20.3 nm (ref >20.5)
LDL-C: 98 mg/dL (ref 0–99)
LP-IR Score: 59 — ABNORMAL HIGH (ref <=45)
Small LDL Particle Number: 862 nmol/L — ABNORMAL HIGH (ref <=527)
Triglycerides by NMR: 146 mg/dL (ref 0–149)

## 2014-11-29 ENCOUNTER — Ambulatory Visit (INDEPENDENT_AMBULATORY_CARE_PROVIDER_SITE_OTHER): Payer: Medicare Other | Admitting: Nurse Practitioner

## 2014-11-29 ENCOUNTER — Encounter: Payer: Self-pay | Admitting: Nurse Practitioner

## 2014-11-29 VITALS — BP 158/82 | HR 68 | Temp 97.0°F | Ht 63.0 in | Wt 155.0 lb

## 2014-11-29 DIAGNOSIS — F411 Generalized anxiety disorder: Secondary | ICD-10-CM | POA: Diagnosis not present

## 2014-11-29 DIAGNOSIS — F32A Depression, unspecified: Secondary | ICD-10-CM

## 2014-11-29 DIAGNOSIS — I10 Essential (primary) hypertension: Secondary | ICD-10-CM | POA: Diagnosis not present

## 2014-11-29 DIAGNOSIS — E785 Hyperlipidemia, unspecified: Secondary | ICD-10-CM | POA: Diagnosis not present

## 2014-11-29 DIAGNOSIS — F329 Major depressive disorder, single episode, unspecified: Secondary | ICD-10-CM | POA: Diagnosis not present

## 2014-11-29 DIAGNOSIS — E039 Hypothyroidism, unspecified: Secondary | ICD-10-CM

## 2014-11-29 MED ORDER — BUPROPION HCL ER (XL) 150 MG PO TB24: 150.0000 mg | ORAL_TABLET | Freq: Every day | ORAL | Status: DC

## 2014-11-29 MED ORDER — AMLODIPINE BESYLATE 5 MG PO TABS: 5.0000 mg | ORAL_TABLET | Freq: Every day | ORAL | Status: DC

## 2014-11-29 MED ORDER — ESCITALOPRAM OXALATE 10 MG PO TABS: 10.0000 mg | ORAL_TABLET | Freq: Every day | ORAL | Status: DC

## 2014-11-29 NOTE — Patient Instructions (Signed)

## 2014-11-29 NOTE — Progress Notes (Signed)
Subjective:    Patient ID: Denise Stewart, female    DOB: [birth year], 74 y.o.   MRN: [id number]   Patient here today for follow up of chronic medical problems.   Hyperlipidemia This is a chronic problem. The current episode started more than 1 year ago. The problem is controlled. Recent lipid tests were reviewed and are normal. Factors aggravating her hyperlipidemia include thiazides. Pertinent negatives include no chest pain or myalgias. Current antihyperlipidemic treatment includes statins. The current treatment provides moderate improvement of lipids. Compliance problems include adherence to diet and adherence to exercise.  Risk factors for coronary artery disease include dyslipidemia, hypertension, obesity and post-menopausal.  Hypertension This is a chronic problem. The current episode started more than 1 year ago. The problem is controlled. Pertinent negatives include no chest pain or palpitations. Risk factors for coronary artery disease include dyslipidemia and post-menopausal state. Past treatments include ACE inhibitors and beta blockers. The current treatment provides moderate improvement. Compliance problems include diet and exercise.  Hypertensive end-organ damage includes a thyroid problem.  Thyroid Problem Visit type: hypothyroidism. Patient reports no constipation, heat intolerance, palpitations or visual change. The symptoms have been stable. Her past medical history is significant for hyperlipidemia.  GAD Klonopin BID- States this medication is keeping her calm but she still feels "figgity" Depression Was on celexa in the past but said had bad reaction to it- said that se feels depressed but is afraiad to take anything- Will just try to deal with it on her own.   Review of Systems  Constitutional: Negative.   HENT: Negative.   Respiratory: Negative.   Cardiovascular: Negative.  Negative for chest pain and palpitations.  Gastrointestinal: Negative.  Negative for constipation.   Endocrine: Negative for heat intolerance.  Genitourinary: Negative.   Musculoskeletal: Negative.  Negative for myalgias.  Neurological: Negative.   Psychiatric/Behavioral: Negative.   All other systems reviewed and are negative.      Objective:   Physical Exam  Constitutional: She is oriented to person, place, and time. She appears well-developed and well-nourished.  HENT:  Nose: Nose normal.  Mouth/Throat: Oropharynx is clear and moist.  Eyes: EOM are normal.  Neck: Trachea normal, normal range of motion and full passive range of motion without pain. Neck supple. No JVD present. Carotid bruit is not present. No thyromegaly present.  Cardiovascular: Normal rate, regular rhythm, normal heart sounds and intact distal pulses.  Exam reveals no gallop and no friction rub.   No murmur heard. Pulmonary/Chest: Effort normal and breath sounds normal.  Abdominal: Soft. Bowel sounds are normal. She exhibits no distension and no mass. There is no tenderness.  Musculoskeletal: Normal range of motion.  Lymphadenopathy:    She has no cervical adenopathy.  Neurological: She is alert and oriented to person, place, and time. She has normal reflexes.  Skin: Skin is warm and dry.  Psychiatric: She has a normal mood and affect. Her behavior is normal. Judgment and thought content normal.    BP 158/82 mmHg  Pulse 68  Temp(Src) 97 F (36.1 C) (Oral)  Ht _0  (1.6 m)  Wt 155 lb (70.308 kg)  BMI 27.46 kg/m2       Assessment & Plan:  1. Essential hypertension Do  Not add salt to diet Added amlodipine to current meds- watch for lower ext edema - CMP14+EGFR - amLODipine (NORVASC) 5 MG tablet; Take 1 tablet (5 mg total) by mouth daily.  Dispense: 30 tablet; Refill: 3  2. Hypothyroidism, unspecified hypothyroidism type - Thyroid  Panel With TSH  3. Hyperlipidemia Low fat diet - Lipid panel  4. Depression Stress management added wellbutrin - buPROPion (WELLBUTRIN XL) 150 MG 24 hr tablet;  Take 1 tablet (150 mg total) by mouth daily.  Dispense: 30 tablet; Refill: 5  5. GAD (generalized anxiety disorder)   Smoking cessation encouraged Labs pending Health maintenance reviewed Diet and exercise encouraged Continue all meds Follow up  In 3 month   [place], FNP

## 2014-11-30 LAB — CMP14+EGFR
ALK PHOS: 93 IU/L (ref 39–117)
ALT: 12 IU/L (ref 0–32)
AST: 18 IU/L (ref 0–40)
Albumin/Globulin Ratio: 1.6 (ref 1.1–2.5)
Albumin: 4.1 g/dL (ref 3.5–4.8)
BILIRUBIN TOTAL: 0.5 mg/dL (ref 0.0–1.2)
BUN/Creatinine Ratio: 14 (ref 11–26)
BUN: 14 mg/dL (ref 8–27)
CHLORIDE: 98 mmol/L (ref 97–108)
CO2: 24 mmol/L (ref 18–29)
Calcium: 9.2 mg/dL (ref 8.7–10.3)
Creatinine, Ser: 0.99 mg/dL (ref 0.57–1.00)
GFR calc Af Amer: 64 mL/min/{1.73_m2} (ref >59)
GFR calc non Af Amer: 56 mL/min/{1.73_m2} — ABNORMAL LOW (ref >59)
GLUCOSE: 111 mg/dL — AB (ref 65–99)
Globulin, Total: 2.6 g/dL (ref 1.5–4.5)
Potassium: 4.2 mmol/L (ref 3.5–5.2)
SODIUM: 140 mmol/L (ref 134–144)
Total Protein: 6.7 g/dL (ref 6.0–8.5)

## 2014-11-30 LAB — LIPID PANEL
CHOLESTEROL TOTAL: 162 mg/dL (ref 100–199)
Chol/HDL Ratio: 4.3 ratio units (ref 0.0–4.4)
HDL: 38 mg/dL — ABNORMAL LOW (ref >39)
LDL CALC: 94 mg/dL (ref 0–99)
TRIGLYCERIDES: 152 mg/dL — AB (ref 0–149)
VLDL Cholesterol Cal: 30 mg/dL (ref 5–40)

## 2014-11-30 LAB — THYROID PANEL WITH TSH
Free Thyroxine Index: 2.2 (ref 1.2–4.9)
T3 UPTAKE RATIO: 23 % — AB (ref 24–39)
T4, Total: 9.5 ug/dL (ref 4.5–12.0)
TSH: 1.03 u[IU]/mL (ref 0.450–4.500)

## 2014-12-21 ENCOUNTER — Telehealth: Payer: Self-pay | Admitting: Nurse Practitioner

## 2014-12-21 NOTE — Telephone Encounter (Signed)
Pt received Rx's for both Wellbutrin & Lexapro from her visit on 11/29/14. She has not started either one yet. Informed pt & [facility] that the Lexapro was discontinued/cancelled by provider and then the Wellbutrin was written. Pt is aware to only start the Wellbutrin prescription.

## 2015-01-19 ENCOUNTER — Ambulatory Visit (INDEPENDENT_AMBULATORY_CARE_PROVIDER_SITE_OTHER): Payer: Medicare Other | Admitting: *Deleted

## 2015-01-19 VITALS — BP 132/79

## 2015-01-19 DIAGNOSIS — I10 Essential (primary) hypertension: Secondary | ICD-10-CM

## 2015-01-19 NOTE — Progress Notes (Signed)
Pt here today for BP check. 

## 2015-01-23 NOTE — Addendum Note (Signed)
Addended by: Margurite AuerbachOMPTON, Yanni Quiroa G on: 01/23/2015 04:30 PM   Modules accepted: Level of Service

## 2015-02-25 ENCOUNTER — Other Ambulatory Visit: Payer: Self-pay | Admitting: Nurse Practitioner

## 2015-03-14 ENCOUNTER — Encounter: Payer: Self-pay | Admitting: Nurse Practitioner

## 2015-03-14 ENCOUNTER — Ambulatory Visit (INDEPENDENT_AMBULATORY_CARE_PROVIDER_SITE_OTHER): Payer: Medicare Other | Admitting: Nurse Practitioner

## 2015-03-14 VITALS — BP 134/82 | HR 71 | Temp 97.3°F | Ht 63.0 in | Wt 154.6 lb

## 2015-03-14 DIAGNOSIS — Z1212 Encounter for screening for malignant neoplasm of rectum: Secondary | ICD-10-CM | POA: Diagnosis not present

## 2015-03-14 DIAGNOSIS — F411 Generalized anxiety disorder: Secondary | ICD-10-CM

## 2015-03-14 DIAGNOSIS — E039 Hypothyroidism, unspecified: Secondary | ICD-10-CM | POA: Diagnosis not present

## 2015-03-14 DIAGNOSIS — F329 Major depressive disorder, single episode, unspecified: Secondary | ICD-10-CM | POA: Diagnosis not present

## 2015-03-14 DIAGNOSIS — Z6827 Body mass index (BMI) 27.0-27.9, adult: Secondary | ICD-10-CM

## 2015-03-14 DIAGNOSIS — E785 Hyperlipidemia, unspecified: Secondary | ICD-10-CM | POA: Diagnosis not present

## 2015-03-14 DIAGNOSIS — I10 Essential (primary) hypertension: Secondary | ICD-10-CM

## 2015-03-14 DIAGNOSIS — F32A Depression, unspecified: Secondary | ICD-10-CM

## 2015-03-14 MED ORDER — LISINOPRIL 40 MG PO TABS: ORAL_TABLET | ORAL | Status: DC

## 2015-03-14 MED ORDER — HYDROCHLOROTHIAZIDE 25 MG PO TABS: ORAL_TABLET | ORAL | Status: DC

## 2015-03-14 MED ORDER — CLONAZEPAM 0.5 MG PO TABS: ORAL_TABLET | ORAL | Status: DC

## 2015-03-14 MED ORDER — LEVOTHYROXINE SODIUM 75 MCG PO TABS: ORAL_TABLET | ORAL | Status: DC

## 2015-03-14 MED ORDER — METOPROLOL SUCCINATE ER 100 MG PO TB24: 100.0000 mg | ORAL_TABLET | Freq: Every day | ORAL | Status: DC

## 2015-03-14 MED ORDER — AMLODIPINE BESYLATE 5 MG PO TABS: ORAL_TABLET | ORAL | Status: DC

## 2015-03-14 NOTE — Patient Instructions (Signed)
Health Maintenance, Female Adopting a healthy lifestyle and getting preventive care can go a long way to promote health and wellness. Talk with your health care provider about what schedule of regular examinations is right for you. This is a good chance for you to check in with your provider about disease prevention and staying healthy. In between checkups, there are plenty of things you can do on your own. Experts have done a lot of research about which lifestyle changes and preventive measures are most likely to keep you healthy. Ask your health care provider for more information. WEIGHT AND DIET  Eat a healthy diet  Be sure to include plenty of vegetables, fruits, low-fat dairy products, and lean protein.  Do not eat a lot of foods high in solid fats, added sugars, or salt.  Get regular exercise. This is one of the most important things you can do for your health.  Most adults should exercise for at least 150 minutes each week. The exercise should increase your heart rate and make you sweat (moderate-intensity exercise).  Most adults should also do strengthening exercises at least twice a week. This is in addition to the moderate-intensity exercise.  Maintain a healthy weight  Body mass index (BMI) is a measurement that can be used to identify possible weight problems. It estimates body fat based on height and weight. Your health care provider can help determine your BMI and help you achieve or maintain a healthy weight.  For females 20 years of age and older:   A BMI below 18.5 is considered underweight.  A BMI of 18.5 to 24.9 is normal.  A BMI of 25 to 29.9 is considered overweight.  A BMI of 30 and above is considered obese.  Watch levels of cholesterol and blood lipids  You should start having your blood tested for lipids and cholesterol at 76 years of age, then have this test every 5 years.  You may need to have your cholesterol levels checked more often if:  Your lipid  or cholesterol levels are high.  You are older than 76 years of age.  You are at high risk for heart disease.  CANCER SCREENING   Lung Cancer  Lung cancer screening is recommended for adults 55-80 years old who are at high risk for lung cancer because of a history of smoking.  A yearly low-dose CT scan of the lungs is recommended for people who:  Currently smoke.  Have quit within the past 15 years.  Have at least a 30-pack-year history of smoking. A pack year is smoking an average of one pack of cigarettes a day for 1 year.  Yearly screening should continue until it has been 15 years since you quit.  Yearly screening should stop if you develop a health problem that would prevent you from having lung cancer treatment.  Breast Cancer  Practice breast self-awareness. This means understanding how your breasts normally appear and feel.  It also means doing regular breast self-exams. Let your health care provider know about any changes, no matter how small.  If you are in your 20s or 30s, you should have a clinical breast exam (CBE) by a health care provider every 1-3 years as part of a regular health exam.  If you are 40 or older, have a CBE every year. Also consider having a breast X-ray (mammogram) every year.  If you have a family history of breast cancer, talk to your health care provider about genetic screening.  If you   are at high risk for breast cancer, talk to your health care provider about having an MRI and a mammogram every year.  Breast cancer gene (BRCA) assessment is recommended for women who have family members with BRCA-related cancers. BRCA-related cancers include:  Breast.  Ovarian.  Tubal.  Peritoneal cancers.  Results of the assessment will determine the need for genetic counseling and BRCA1 and BRCA2 testing. Cervical Cancer Your health care provider may recommend that you be screened regularly for cancer of the pelvic organs (ovaries, uterus, and  vagina). This screening involves a pelvic examination, including checking for microscopic changes to the surface of your cervix (Pap test). You may be encouraged to have this screening done every 3 years, beginning at age 21.  For women ages 30-65, health care providers may recommend pelvic exams and Pap testing every 3 years, or they may recommend the Pap and pelvic exam, combined with testing for human papilloma virus (HPV), every 5 years. Some types of HPV increase your risk of cervical cancer. Testing for HPV may also be done on women of any age with unclear Pap test results.  Other health care providers may not recommend any screening for nonpregnant women who are considered low risk for pelvic cancer and who do not have symptoms. Ask your health care provider if a screening pelvic exam is right for you.  If you have had past treatment for cervical cancer or a condition that could lead to cancer, you need Pap tests and screening for cancer for at least 20 years after your treatment. If Pap tests have been discontinued, your risk factors (such as having a new sexual partner) need to be reassessed to determine if screening should resume. Some women have medical problems that increase the chance of getting cervical cancer. In these cases, your health care provider may recommend more frequent screening and Pap tests. Colorectal Cancer  This type of cancer can be detected and often prevented.  Routine colorectal cancer screening usually begins at 76 years of age and continues through 75 years of age.  Your health care provider may recommend screening at an earlier age if you have risk factors for colon cancer.  Your health care provider may also recommend using home test kits to check for hidden blood in the stool.  A small camera at the end of a tube can be used to examine your colon directly (sigmoidoscopy or colonoscopy). This is done to check for the earliest forms of colorectal  cancer.  Routine screening usually begins at age 50.  Direct examination of the colon should be repeated every 5-10 years through 75 years of age. However, you may need to be screened more often if early forms of precancerous polyps or small growths are found. Skin Cancer  Check your skin from head to toe regularly.  Tell your health care provider about any new moles or changes in moles, especially if there is a change in a mole's shape or color.  Also tell your health care provider if you have a mole that is larger than the size of a pencil eraser.  Always use sunscreen. Apply sunscreen liberally and repeatedly throughout the day.  Protect yourself by wearing long sleeves, pants, a wide-brimmed hat, and sunglasses whenever you are outside. HEART DISEASE, DIABETES, AND HIGH BLOOD PRESSURE   High blood pressure causes heart disease and increases the risk of stroke. High blood pressure is more likely to develop in:  People who have blood pressure in the high end   of the normal range (130-139/85-89 mm Hg).  People who are overweight or obese.  People who are African American.  If you are 38-23 years of age, have your blood pressure checked every 3-5 years. If you are 61 years of age or older, have your blood pressure checked every year. You should have your blood pressure measured twice--once when you are at a hospital or clinic, and once when you are not at a hospital or clinic. Record the average of the two measurements. To check your blood pressure when you are not at a hospital or clinic, you can use:  An automated blood pressure machine at a pharmacy.  A home blood pressure monitor.  If you are between 45 years and 39 years old, ask your health care provider if you should take aspirin to prevent strokes.  Have regular diabetes screenings. This involves taking a blood sample to check your fasting blood sugar level.  If you are at a normal weight and have a low risk for diabetes,  have this test once every three years after 76 years of age.  If you are overweight and have a high risk for diabetes, consider being tested at a younger age or more often. PREVENTING INFECTION  Hepatitis B  If you have a higher risk for hepatitis B, you should be screened for this virus. You are considered at high risk for hepatitis B if:  You were born in a country where hepatitis B is common. Ask your health care provider which countries are considered high risk.  Your parents were born in a high-risk country, and you have not been immunized against hepatitis B (hepatitis B vaccine).  You have HIV or AIDS.  You use needles to inject street drugs.  You live with someone who has hepatitis B.  You have had sex with someone who has hepatitis B.  You get hemodialysis treatment.  You take certain medicines for conditions, including cancer, organ transplantation, and autoimmune conditions. Hepatitis C  Blood testing is recommended for:  Everyone born from [birth year] through 1967.  Anyone with known risk factors for hepatitis C. Sexually transmitted infections (STIs)  You should be screened for sexually transmitted infections (STIs) including gonorrhea and chlamydia if:  You are sexually active and are younger than 76 years of age.  You are older than 76 years of age and your health care provider tells you that you are at risk for this type of infection.  Your sexual activity has changed since you were last screened and you are at an increased risk for chlamydia or gonorrhea. Ask your health care provider if you are at risk.  If you do not have HIV, but are at risk, it may be recommended that you take a prescription medicine daily to prevent HIV infection. This is called pre-exposure prophylaxis (PrEP). You are considered at risk if:  You are sexually active and do not regularly use condoms or know the HIV status of your partner(s).  You take drugs by injection.  You are sexually  active with a partner who has HIV. Talk with your health care provider about whether you are at high risk of being infected with HIV. If you choose to begin PrEP, you should first be tested for HIV. You should then be tested every 3 months for as long as you are taking PrEP.  PREGNANCY   If you are premenopausal and you may become pregnant, ask your health care provider about preconception counseling.  If you may  become pregnant, take 400 to 800 micrograms (mcg) of folic acid every day.  If you want to prevent pregnancy, talk to your health care provider about birth control (contraception). OSTEOPOROSIS AND MENOPAUSE   Osteoporosis is a disease in which the bones lose minerals and strength with aging. This can result in serious bone fractures. Your risk for osteoporosis can be identified using a bone density scan.  If you are 61 years of age or older, or if you are at risk for osteoporosis and fractures, ask your health care provider if you should be screened.  Ask your health care provider whether you should take a calcium or vitamin D supplement to lower your risk for osteoporosis.  Menopause may have certain physical symptoms and risks.  Hormone replacement therapy may reduce some of these symptoms and risks. Talk to your health care provider about whether hormone replacement therapy is right for you.  HOME CARE INSTRUCTIONS   Schedule regular health, dental, and eye exams.  Stay current with your immunizations.   Do not use any tobacco products including cigarettes, chewing tobacco, or electronic cigarettes.  If you are pregnant, do not drink alcohol.  If you are breastfeeding, limit how much and how often you drink alcohol.  Limit alcohol intake to no more than 1 drink per day for nonpregnant women. One drink equals 12 ounces of beer, 5 ounces of wine, or 1 ounces of hard liquor.  Do not use street drugs.  Do not share needles.  Ask your health care provider for help if  you need support or information about quitting drugs.  Tell your health care provider if you often feel depressed.  Tell your health care provider if you have ever been abused or do not feel safe at home.   This information is not intended to replace advice given to you by your health care provider. Make sure you discuss any questions you have with your health care provider.   Document Released: 10/08/2010 Document Revised: 04/15/2014 Document Reviewed: 02/24/2013 Elsevier Interactive Patient Education [employer].

## 2015-03-14 NOTE — Progress Notes (Signed)
Subjective:    Patient ID: Denise Stewart, female    DOB: 12-18-1940, 74 y.o.   MRN: [id number]   Patient here today for follow up of chronic medical problems.   Hyperlipidemia This is a chronic problem. The current episode started more than 1 year ago. The problem is controlled. Recent lipid tests were reviewed and are normal. Factors aggravating her hyperlipidemia include thiazides. Pertinent negatives include no chest pain or myalgias. Current antihyperlipidemic treatment includes statins. The current treatment provides moderate improvement of lipids. Compliance problems include adherence to diet and adherence to exercise.  Risk factors for coronary artery disease include dyslipidemia, hypertension, obesity and post-menopausal.  Hypertension This is a chronic problem. The current episode started more than 1 year ago. The problem is controlled. Pertinent negatives include no chest pain or palpitations. Risk factors for coronary artery disease include dyslipidemia and post-menopausal state. Past treatments include ACE inhibitors and beta blockers. The current treatment provides moderate improvement. Compliance problems include diet and exercise.  Hypertensive end-organ damage includes a thyroid problem.  Thyroid Problem Visit type: hypothyroidism. Patient reports no constipation, heat intolerance, palpitations or visual change. The symptoms have been stable. Her past medical history is significant for hyperlipidemia.  GAD Klonopin BID- States this medication is keeping her calm but she still feels "figgity" Depression Was on celexa in the past but said had bad reaction to it- said that se feels depressed but is afraiad to take anything- Will just try to deal with it on her own. We gave her rx for wellbutrin at last visit but she did  Not start taking.   Review of Systems  Constitutional: Negative.   HENT: Negative.   Respiratory: Negative.   Cardiovascular: Negative.  Negative for chest pain  and palpitations.  Gastrointestinal: Negative.  Negative for constipation.  Endocrine: Negative for heat intolerance.  Genitourinary: Negative.   Musculoskeletal: Negative.  Negative for myalgias.  Neurological: Negative.   Psychiatric/Behavioral: Negative.   All other systems reviewed and are negative.      Objective:   Physical Exam  Constitutional: She is oriented to person, place, and time. She appears well-developed and well-nourished.  HENT:  Nose: Nose normal.  Mouth/Throat: Oropharynx is clear and moist.  Eyes: EOM are normal.  Neck: Trachea normal, normal range of motion and full passive range of motion without pain. Neck supple. No JVD present. Carotid bruit is not present. No thyromegaly present.  Cardiovascular: Normal rate, regular rhythm, normal heart sounds and intact distal pulses.  Exam reveals no gallop and no friction rub.   No murmur heard. Pulmonary/Chest: Effort normal and breath sounds normal.  Abdominal: Soft. Bowel sounds are normal. She exhibits no distension and no mass. There is no tenderness.  Musculoskeletal: Normal range of motion.  Lymphadenopathy:    She has no cervical adenopathy.  Neurological: She is alert and oriented to person, place, and time. She has normal reflexes.  Skin: Skin is warm and dry.  Psychiatric: She has a normal mood and affect. Her behavior is normal. Judgment and thought content normal.    BP 134/82 mmHg  Pulse 71  Temp(Src) 97.3 F (36.3 C) (Oral)  Ht 5' 3"  (1.6 m)  Wt 154 lb 9.6 oz (70.126 kg)  BMI 27.39 kg/m2         Assessment & Plan:   1. Essential hypertension Do not add saltt o diet - metoprolol succinate (TOPROL-XL) 100 MG 24 hr tablet; Take 1 tablet (100 mg total) by mouth daily. with food  Dispense: 30 tablet; Refill: 5 - lisinopril (PRINIVIL,ZESTRIL) 40 MG tablet; TAKE ONE (1) TABLET EACH DAY  Dispense: 30 tablet; Refill: 5 - hydrochlorothiazide (HYDRODIURIL) 25 MG tablet; TAKE ONE (1) TABLET EACH DAY   Dispense: 30 tablet; Refill: 5 - amLODipine (NORVASC) 5 MG tablet; TAKE ONE (1) TABLET EACH DAY  Dispense: 30 tablet; Refill: 2 - CMP14+EGFR  2. Hypothyroidism, unspecified hypothyroidism type - levothyroxine (SYNTHROID, LEVOTHROID) 75 MCG tablet; TAKE ONE (1) TABLET EACH DAY  Dispense: 30 tablet; Refill: 5 - Thyroid Panel With TSH  3. Hyperlipidemia Low fat diet - Lipid panel  4. Depression stres management  5. GAD (generalized anxiety disorder) - clonazePAM (KLONOPIN) 0.5 MG tablet; TAKE ONE TABLET TWICE DAILY AS NEEDED  Dispense: 60 tablet; Refill: 2  6. BMI 27.0-27.9,adult Discussed diet and exercise for person with BMI >25 Will recheck weight in 3-6 months  7. Screening for malignant neoplasm of the rectum - Fecal occult blood, imunochemical; Future    Labs pending Health maintenance reviewed Diet and exercise encouraged Continue all meds Follow up  In 3 months   [place], FNP

## 2015-03-14 NOTE — Addendum Note (Signed)
Addended by: Bennie PieriniMARTIN, MARY-MARGARET on: 03/14/2015 03:17 PM   Modules accepted: Kipp BroodSmartSet

## 2015-03-15 LAB — CMP14+EGFR
ALBUMIN: 4.3 g/dL (ref 3.5–4.8)
ALT: 12 IU/L (ref 0–32)
AST: 18 IU/L (ref 0–40)
Albumin/Globulin Ratio: 1.6 (ref 1.1–2.5)
Alkaline Phosphatase: 103 IU/L (ref 39–117)
BILIRUBIN TOTAL: 0.6 mg/dL (ref 0.0–1.2)
BUN / CREAT RATIO: 14 (ref 11–26)
BUN: 13 mg/dL (ref 8–27)
CALCIUM: 9 mg/dL (ref 8.7–10.3)
CO2: 28 mmol/L (ref 18–29)
CREATININE: 0.93 mg/dL (ref 0.57–1.00)
Chloride: 99 mmol/L (ref 97–106)
GFR, EST AFRICAN AMERICAN: 69 mL/min/{1.73_m2} (ref >59)
GFR, EST NON AFRICAN AMERICAN: 60 mL/min/{1.73_m2} (ref >59)
GLUCOSE: 99 mg/dL (ref 65–99)
Globulin, Total: 2.7 g/dL (ref 1.5–4.5)
Potassium: 4.1 mmol/L (ref 3.5–5.2)
Sodium: 140 mmol/L (ref 136–144)
TOTAL PROTEIN: 7 g/dL (ref 6.0–8.5)

## 2015-03-15 LAB — THYROID PANEL WITH TSH
FREE THYROXINE INDEX: 2.9 (ref 1.2–4.9)
T3 UPTAKE RATIO: 25 % (ref 24–39)
T4, Total: 11.4 ug/dL (ref 4.5–12.0)
TSH: 1.41 u[IU]/mL (ref 0.450–4.500)

## 2015-03-15 LAB — LIPID PANEL
CHOL/HDL RATIO: 4.3 ratio (ref 0.0–4.4)
Cholesterol, Total: 176 mg/dL (ref 100–199)
HDL: 41 mg/dL (ref >39)
LDL Calculated: 100 mg/dL — ABNORMAL HIGH (ref 0–99)
Triglycerides: 173 mg/dL — ABNORMAL HIGH (ref 0–149)
VLDL Cholesterol Cal: 35 mg/dL (ref 5–40)

## 2015-03-28 ENCOUNTER — Other Ambulatory Visit: Payer: Self-pay | Admitting: Nurse Practitioner

## 2015-03-28 NOTE — Telephone Encounter (Signed)
Last seen 03/14/15  MMM  If approved route to nurse to [facility]

## 2015-03-28 NOTE — Telephone Encounter (Signed)
rx called into pharmacy

## 2015-03-28 NOTE — Telephone Encounter (Signed)
Please call in klonopin with 1 refills 

## 2015-06-15 ENCOUNTER — Telehealth: Payer: Self-pay | Admitting: Nurse Practitioner

## 2015-06-15 NOTE — Telephone Encounter (Signed)
Pt is having head congestion no fever, has only taken aspirin no OTC med. Suggested trying Mucinex Sinus give that a day or two & let us know.

## 2015-07-11 ENCOUNTER — Ambulatory Visit (INDEPENDENT_AMBULATORY_CARE_PROVIDER_SITE_OTHER): Payer: Commercial Managed Care - HMO | Admitting: Nurse Practitioner

## 2015-07-11 ENCOUNTER — Encounter: Payer: Self-pay | Admitting: Nurse Practitioner

## 2015-07-11 VITALS — BP 145/91 | HR 75 | Temp 97.3°F | Ht 63.0 in | Wt 152.0 lb

## 2015-07-11 DIAGNOSIS — J0111 Acute recurrent frontal sinusitis: Secondary | ICD-10-CM | POA: Diagnosis not present

## 2015-07-11 DIAGNOSIS — F329 Major depressive disorder, single episode, unspecified: Secondary | ICD-10-CM

## 2015-07-11 DIAGNOSIS — F32A Depression, unspecified: Secondary | ICD-10-CM

## 2015-07-11 MED ORDER — AZITHROMYCIN 250 MG PO TABS: ORAL_TABLET | ORAL | Status: DC

## 2015-07-11 MED ORDER — CITALOPRAM HYDROBROMIDE 20 MG PO TABS: 20.0000 mg | ORAL_TABLET | Freq: Every day | ORAL | Status: DC

## 2015-07-11 NOTE — Progress Notes (Addendum)
Subjective:     Denise Stewart is a 75 y.o. female who presents for evaluation of sinus pain. Symptoms include: clear rhinorrhea, congestion, cough, facial pain, headaches, nasal congestion and sinus pressure. Onset of symptoms was 6 days ago. Symptoms have been unchanged since that time. Past history is significant for no history of pneumonia or bronchitis. Patient is a smoker  (1 ppd x 50 yrs).  The following portions of the patient's history were reviewed and updated as appropriate: allergies, current medications, past family history, past medical history, past social history, past surgical history and problem list.  * patient was diagnosed with depression several months ago and was given rx for wellbutrin but patient said was going to be to expensive and would like to try something cheaper.  [facility] 07/11/2015 11/29/2014 03/18/2014 08/13/2013 03/01/2013  Decreased Interest 3 3 0 1 0  Down, Depressed, Hopeless 3 2 0 1 0  PHQ - 2 Score 6 5 0 2 0  Altered sleeping 0 - - 0 -  Tired, decreased energy 3 - - 0 -  Change in appetite 2 - - 1 -  Feeling bad or failure about yourself  2 - - 0 -  Trouble concentrating 1 - - 0 -  Moving slowly or fidgety/restless 0 - - 0 -  Suicidal thoughts 0 - - 0 -  PHQ-9 Score 14 - - 3 -  Difficult doing work/chores Somewhat difficult - - - -        Review of Systems Pertinent items are noted in HPI.   Objective:    BP 145/91 mmHg  Pulse 75  Temp(Src) 97.3 F (36.3 C) (Oral)  Ht 5\' 3"  (1.6 m)  Wt 152 lb (68.947 kg)  BMI 26.93 kg/m2 General appearance: alert and cooperative Eyes: conjunctivae/corneas clear. PERRL, EOM's intact. Fundi benign. Ears: normal TM's and external ear canals both ears Nose: clear discharge, moderate congestion, turbinates red, sinus tenderness bilateral Throat: lips, mucosa, and tongue normal; teeth and gums normal Neck: no adenopathy, no carotid bruit, no JVD, supple, symmetrical, trachea midline and  thyroid not enlarged, symmetric, no tenderness/mass/nodules Lungs: clear to auscultation bilaterally and smokers cough Heart: regular rate and rhythm, S1, S2 normal, no murmur, click, rub or gallop         Assessment:    Acute bacterial sinusitis Depression.    Plan:  1. Take meds as prescribed 2. Use a cool mist humidifier especially during the winter months and when heat has been humid. 3. Use saline nose sprays frequently 4. Saline irrigations of the nose can be very helpful if done frequently.  * 4X daily for 1 week*  * Use of a nettie pot can be helpful with this. Follow directions with this* 5. Drink plenty of fluids 6. Keep thermostat turn down low 7.For any cough or congestion  Use plain Mucinex- regular strength or max strength is fine   * Children- consult with Pharmacist for dosing 8. For fever or aces or pains- take tylenol or ibuprofen appropriate for age and weight.  * for fevers greater than 101 orally you may alternate ibuprofen and tylenol every  3 hours.    Meds ordered this encounter  Medications  . azithromycin (ZITHROMAX) 250 MG tablet    Sig: Two tablets day one, then one tablet daily next 4 days.    Dispense:  6 tablet    Refill:  0    Order Specific Question:  Supervising Provider    Answer:  Rudi Heap W [[id number]]  . citalopram (CELEXA) 20 MG tablet    Sig: Take 1 tablet (20 mg total) by mouth daily.    Dispense:  30 tablet    Refill:  5    Order Specific Question:  Supervising Provider    Answer:  Deborra Medina   Stress management Exercise daily Eat 3 meals a day Follow up in 2 months  Mary-Margaret Daphine Deutscher, FNP

## 2015-07-11 NOTE — Addendum Note (Signed)
Addended by: Bennie PieriniMARTIN, MARY-MARGARET on: 07/11/2015 03:04 PM   Modules accepted: Orders, Medications

## 2015-07-11 NOTE — Patient Instructions (Signed)

## 2015-08-23 ENCOUNTER — Other Ambulatory Visit: Payer: Self-pay | Admitting: Nurse Practitioner

## 2015-09-25 ENCOUNTER — Other Ambulatory Visit: Payer: Self-pay | Admitting: Nurse Practitioner

## 2015-10-20 ENCOUNTER — Encounter: Payer: Self-pay | Admitting: Nurse Practitioner

## 2015-10-20 ENCOUNTER — Ambulatory Visit (INDEPENDENT_AMBULATORY_CARE_PROVIDER_SITE_OTHER): Payer: Commercial Managed Care - HMO | Admitting: Nurse Practitioner

## 2015-10-20 VITALS — BP 139/71 | HR 65 | Temp 96.6°F | Ht 63.0 in | Wt 153.0 lb

## 2015-10-20 DIAGNOSIS — E785 Hyperlipidemia, unspecified: Secondary | ICD-10-CM | POA: Diagnosis not present

## 2015-10-20 DIAGNOSIS — E039 Hypothyroidism, unspecified: Secondary | ICD-10-CM | POA: Diagnosis not present

## 2015-10-20 DIAGNOSIS — F329 Major depressive disorder, single episode, unspecified: Secondary | ICD-10-CM | POA: Diagnosis not present

## 2015-10-20 DIAGNOSIS — Z23 Encounter for immunization: Secondary | ICD-10-CM

## 2015-10-20 DIAGNOSIS — F32A Depression, unspecified: Secondary | ICD-10-CM

## 2015-10-20 DIAGNOSIS — F411 Generalized anxiety disorder: Secondary | ICD-10-CM

## 2015-10-20 DIAGNOSIS — I10 Essential (primary) hypertension: Secondary | ICD-10-CM

## 2015-10-20 LAB — CMP14+EGFR
A/G RATIO: 1.5 (ref 1.2–2.2)
ALBUMIN: 4 g/dL (ref 3.5–4.8)
ALK PHOS: 87 IU/L (ref 39–117)
ALT: 9 IU/L (ref 0–32)
AST: 13 IU/L (ref 0–40)
BUN / CREAT RATIO: 14 (ref 12–28)
BUN: 15 mg/dL (ref 8–27)
Bilirubin Total: 0.4 mg/dL (ref 0.0–1.2)
CALCIUM: 9.2 mg/dL (ref 8.7–10.3)
CO2: 26 mmol/L (ref 18–29)
CREATININE: 1.07 mg/dL — AB (ref 0.57–1.00)
Chloride: 98 mmol/L (ref 96–106)
GFR calc Af Amer: 58 mL/min/{1.73_m2} — ABNORMAL LOW (ref >59)
GFR, EST NON AFRICAN AMERICAN: 50 mL/min/{1.73_m2} — AB (ref >59)
GLOBULIN, TOTAL: 2.6 g/dL (ref 1.5–4.5)
Glucose: 106 mg/dL — ABNORMAL HIGH (ref 65–99)
POTASSIUM: 4.1 mmol/L (ref 3.5–5.2)
SODIUM: 140 mmol/L (ref 134–144)
Total Protein: 6.6 g/dL (ref 6.0–8.5)

## 2015-10-20 LAB — LIPID PANEL
Chol/HDL Ratio: 3.9 ratio (ref 0.0–4.4)
Cholesterol, Total: 149 mg/dL (ref 100–199)
HDL: 38 mg/dL — ABNORMAL LOW (ref >39)
LDL Calculated: 86 mg/dL (ref 0–99)
Triglycerides: 124 mg/dL (ref 0–149)
VLDL Cholesterol Cal: 25 mg/dL (ref 5–40)

## 2015-10-20 MED ORDER — METOPROLOL SUCCINATE ER 100 MG PO TB24: 100.0000 mg | ORAL_TABLET | Freq: Every day | ORAL | Status: DC

## 2015-10-20 MED ORDER — LISINOPRIL 40 MG PO TABS: 40.0000 mg | ORAL_TABLET | Freq: Every day | ORAL | Status: DC

## 2015-10-20 MED ORDER — AMLODIPINE BESYLATE 5 MG PO TABS: 5.0000 mg | ORAL_TABLET | Freq: Every day | ORAL | Status: DC

## 2015-10-20 MED ORDER — CITALOPRAM HYDROBROMIDE 20 MG PO TABS: 20.0000 mg | ORAL_TABLET | Freq: Every day | ORAL | Status: DC

## 2015-10-20 MED ORDER — LEVOTHYROXINE SODIUM 75 MCG PO TABS: 75.0000 ug | ORAL_TABLET | Freq: Every day | ORAL | Status: DC

## 2015-10-20 MED ORDER — CLONAZEPAM 0.5 MG PO TABS: 0.5000 mg | ORAL_TABLET | Freq: Two times a day (BID) | ORAL | Status: DC | PRN

## 2015-10-20 MED ORDER — HYDROCHLOROTHIAZIDE 25 MG PO TABS: 25.0000 mg | ORAL_TABLET | Freq: Every day | ORAL | Status: DC

## 2015-10-20 NOTE — Addendum Note (Signed)
Addended by: Cleda DaubUCKER, Tariyah Pendry G on: 10/20/2015 03:50 PM   Modules accepted: Orders

## 2015-10-20 NOTE — Progress Notes (Addendum)
Subjective:    Patient ID: Denise Stewart, female    DOB: 1940/05/28, 75 y.o.   MRN: [id number]   Patient here today for follow up of chronic medical problems.  Outpatient Encounter Prescriptions as of 10/20/2015  Medication Sig  . amLODipine (NORVASC) 5 MG tablet TAKE ONE (1) TABLET EACH DAY  . aspirin 81 MG tablet Take 81 mg by mouth daily.  . calcium carbonate (OS-CAL) 600 MG TABS Take 600 mg by mouth 2 (two) times daily with a meal.  . Cholecalciferol (VITAMIN D-3) 1000 UNITS CAPS Take 1,000 Units by mouth daily.  . citalopram (CELEXA) 20 MG tablet Take 1 tablet (20 mg total) by mouth daily.  . clonazePAM (KLONOPIN) 0.5 MG tablet TAKE ONE TABLET TWICE DAILY AS NEEDED  . fish oil-omega-3 fatty acids 1000 MG capsule Take 2 g by mouth daily.  . hydrochlorothiazide (HYDRODIURIL) 25 MG tablet TAKE ONE (1) TABLET EACH DAY  . levothyroxine (SYNTHROID, LEVOTHROID) 75 MCG tablet TAKE ONE (1) TABLET EACH DAY  . lisinopril (PRINIVIL,ZESTRIL) 40 MG tablet TAKE ONE (1) TABLET EACH DAY  . metoprolol succinate (TOPROL-XL) 100 MG 24 hr tablet TAKE 1 TABLET DAILY WITH FOOD         Hyperlipidemia This is a chronic problem. The current episode started more than 1 year ago. The problem is controlled. Recent lipid tests were reviewed and are normal. Factors aggravating her hyperlipidemia include thiazides. Pertinent negatives include no chest pain or myalgias. Current antihyperlipidemic treatment includes statins. The current treatment provides moderate improvement of lipids. Compliance problems include adherence to diet and adherence to exercise.  Risk factors for coronary artery disease include dyslipidemia, hypertension, obesity and post-menopausal.  Hypertension This is a chronic problem. The current episode started more than 1 year ago. The problem is controlled. Pertinent negatives include no chest pain or palpitations. Risk factors for coronary artery disease include dyslipidemia and post-menopausal  state. Past treatments include ACE inhibitors and beta blockers. The current treatment provides moderate improvement. Compliance problems include diet and exercise.  Hypertensive end-organ damage includes a thyroid problem.  Thyroid Problem Visit type: hypothyroidism. Patient reports no constipation, heat intolerance, palpitations or visual change. The symptoms have been stable. Her past medical history is significant for hyperlipidemia.  GAD Klonopin BID- States this medication is keeping her calm but she still feels "figgity" Depression Was on celexa in the past but said had bad reaction to it- said that se feels depressed but is afraiad to take anything- Will just try to deal with it on her own. We gave her rx for wellbutrin at last visit but she did  Not start taking. Depression screen [facility] 2/9 10/20/2015 07/11/2015 11/29/2014 03/18/2014 08/13/2013  Decreased Interest 0 3 3 0 1  Down, Depressed, Hopeless 0 3 2 0 1  PHQ - 2 Score 0 6 5 0 2  Altered sleeping - 0 - - 0  Tired, decreased energy - 3 - - 0  Change in appetite - 2 - - 1  Feeling bad or failure about yourself  - 2 - - 0  Trouble concentrating - 1 - - 0  Moving slowly or fidgety/restless - 0 - - 0  Suicidal thoughts - 0 - - 0  PHQ-9 Score - 14 - - 3  Difficult doing work/chores - Somewhat difficult - - -      Review of Systems  Constitutional: Negative.   HENT: Negative.   Respiratory: Negative.   Cardiovascular: Negative.  Negative for chest pain and palpitations.  Gastrointestinal: Negative.  Negative for constipation.  Endocrine: Negative for heat intolerance.  Genitourinary: Negative.   Musculoskeletal: Negative.  Negative for myalgias.  Neurological: Negative.   Psychiatric/Behavioral: Negative.   All other systems reviewed and are negative.      Objective:   Physical Exam  Constitutional: She is oriented to person, place, and time. She appears well-developed and well-nourished.  HENT:  Nose: Nose normal.   Mouth/Throat: Oropharynx is clear and moist.  Eyes: EOM are normal.  Neck: Trachea normal, normal range of motion and full passive range of motion without pain. Neck supple. No JVD present. Carotid bruit is not present. No thyromegaly present.  Cardiovascular: Normal rate, regular rhythm, normal heart sounds and intact distal pulses.  Exam reveals no gallop and no friction rub.   No murmur heard. Pulmonary/Chest: Effort normal and breath sounds normal.  Abdominal: Soft. Bowel sounds are normal. She exhibits no distension and no mass. There is no tenderness.  Musculoskeletal: Normal range of motion.  Lymphadenopathy:    She has no cervical adenopathy.  Neurological: She is alert and oriented to person, place, and time. She has normal reflexes.  Skin: Skin is warm and dry.  Psychiatric: She has a normal mood and affect. Her behavior is normal. Judgment and thought content normal.    BP 139/71 mmHg  Pulse 65  Temp(Src) 96.6 F (35.9 C) (Oral)  Ht _0  (1.6 m)  Wt 153 lb (69.4 kg)  BMI 27.11 kg/m2       Assessment & Plan:   1. Essential hypertension Do not add salt to diet - metoprolol succinate (TOPROL-XL) 100 MG 24 hr tablet; Take 1 tablet (100 mg total) by mouth daily. with food  Dispense: 30 tablet; Refill: 5 - lisinopril (PRINIVIL,ZESTRIL) 40 MG tablet; Take 1 tablet (40 mg total) by mouth daily.  Dispense: 30 tablet; Refill: 5 - hydrochlorothiazide (HYDRODIURIL) 25 MG tablet; Take 1 tablet (25 mg total) by mouth daily.  Dispense: 30 tablet; Refill: 5 - amLODipine (NORVASC) 5 MG tablet; Take 1 tablet (5 mg total) by mouth daily.  Dispense: 30 tablet; Refill: 6 - CMP14+EGFR  2. Hypothyroidism, unspecified hypothyroidism type - levothyroxine (SYNTHROID, LEVOTHROID) 75 MCG tablet; Take 1 tablet (75 mcg total) by mouth daily before breakfast.  Dispense: 30 tablet; Refill: 5  3. Hyperlipidemia Low fat diet - Lipid panel  4. Depression Stress management - citalopram  (CELEXA) 20 MG tablet; Take 1 tablet (20 mg total) by mouth daily.  Dispense: 30 tablet; Refill: 5  5. GAD (generalized anxiety disorder) - clonazePAM (KLONOPIN) 0.5 MG tablet; Take 1 tablet (0.5 mg total) by mouth 2 (two) times daily as needed for anxiety.  Dispense: 60 tablet; Refill: 1    Labs pending Health maintenance reviewed Diet and exercise encouraged Continue all meds Follow up  In 6 months   [place], FNP

## 2015-12-18 ENCOUNTER — Telehealth: Payer: Self-pay | Admitting: Nurse Practitioner

## 2015-12-18 NOTE — Telephone Encounter (Signed)
Denied.

## 2016-03-25 ENCOUNTER — Other Ambulatory Visit: Payer: Self-pay | Admitting: Nurse Practitioner

## 2016-03-25 DIAGNOSIS — F411 Generalized anxiety disorder: Secondary | ICD-10-CM

## 2016-03-25 NOTE — Telephone Encounter (Signed)
Please call in klonopin with 1 refills 

## 2016-03-25 NOTE — Telephone Encounter (Signed)
Refill called to [facility] 

## 2016-04-23 ENCOUNTER — Encounter: Payer: Self-pay | Admitting: Nurse Practitioner

## 2016-04-23 ENCOUNTER — Ambulatory Visit (INDEPENDENT_AMBULATORY_CARE_PROVIDER_SITE_OTHER): Payer: Commercial Managed Care - HMO | Admitting: Nurse Practitioner

## 2016-04-23 ENCOUNTER — Encounter (INDEPENDENT_AMBULATORY_CARE_PROVIDER_SITE_OTHER): Payer: Self-pay

## 2016-04-23 VITALS — BP 136/76 | HR 66 | Temp 98.2°F | Ht 63.0 in | Wt 152.0 lb

## 2016-04-23 DIAGNOSIS — J0100 Acute maxillary sinusitis, unspecified: Secondary | ICD-10-CM

## 2016-04-23 MED ORDER — AZITHROMYCIN 250 MG PO TABS: ORAL_TABLET | ORAL | 0 refills | Status: DC

## 2016-04-23 NOTE — Patient Instructions (Signed)

## 2016-04-23 NOTE — Progress Notes (Signed)
Subjective:     Denise Stewart is a 76 y.o. female who presents for evaluation of sinus pain. Symptoms include: congestion, cough, facial pain, headaches, nasal congestion, post nasal drip and sinus pressure. Onset of symptoms was 2 weeks ago. Symptoms have been unchanged since that time. Past history is significant for chronic bronchitis. Patient is a smoker  (1 ppd x 65 yrs).  The following portions of the patient's history were reviewed and updated as appropriate: allergies, current medications, past family history, past medical history, past social history, past surgical history and problem list.  Review of Systems Pertinent items noted in HPI and remainder of comprehensive ROS otherwise negative.   Objective:    BP 136/76   Pulse 66   Temp 98.2 F (36.8 C) (Oral)   Ht 5\' 3"  (1.6 m)   Wt 152 lb (68.9 kg)   BMI 26.93 kg/m  General appearance: alert, cooperative and no distress Eyes: conjunctivae/corneas clear. PERRL, EOM's intact. Fundi benign. Ears: normal TM's and external ear canals both ears Nose: clear discharge, moderate congestion, turbinates red, sinus tenderness bilateral Throat: lips, mucosa, and tongue normal; teeth and gums normal Neck: no adenopathy, no carotid bruit, no JVD, supple, symmetrical, trachea midline and thyroid not enlarged, symmetric, no tenderness/mass/nodules Lungs: clear to auscultation bilaterally and diminished breath sound bil bases Heart: regular rate and rhythm, S1, S2 normal, no murmur, click, rub or gallop    Assessment:    Acute bacterial sinusitis.    Plan:    hold celexa while on z pak 1. Take meds as prescribed 2. Use a cool mist humidifier especially during the winter months and when heat has been humid. 3. Use saline nose sprays frequently 4. Saline irrigations of the nose can be very helpful if done frequently.  * 4X daily for 1 week*  * Use of a nettie pot can be helpful with this. Follow directions with this* 5. Drink plenty of  fluids 6. Keep thermostat turn down low 7.For any cough or congestion  Use plain Mucinex- regular strength or max strength is fine   * Children- consult with Pharmacist for dosing 8. For fever or aces or pains- take tylenol or ibuprofen appropriate for age and weight.  * for fevers greater than 101 orally you may alternate ibuprofen and tylenol every  3 hours.   Meds ordered this encounter  Medications  . azithromycin (ZITHROMAX) 250 MG tablet    Sig: Two tablets day one, then one tablet daily next 4 days.    Dispense:  6 tablet    Refill:  0    Order Specific Question:   Supervising Provider    Answer:   Johna SheriffVINCENT, CAROL L [[id number]]   Denise Daphine DeutscherMartin, FNP

## 2016-06-24 ENCOUNTER — Other Ambulatory Visit: Payer: Self-pay | Admitting: Nurse Practitioner

## 2016-06-24 DIAGNOSIS — F329 Major depressive disorder, single episode, unspecified: Secondary | ICD-10-CM

## 2016-06-24 DIAGNOSIS — F32A Depression, unspecified: Secondary | ICD-10-CM

## 2016-07-22 ENCOUNTER — Other Ambulatory Visit: Payer: Self-pay | Admitting: Nurse Practitioner

## 2016-07-22 DIAGNOSIS — I10 Essential (primary) hypertension: Secondary | ICD-10-CM

## 2016-07-22 DIAGNOSIS — F329 Major depressive disorder, single episode, unspecified: Secondary | ICD-10-CM

## 2016-07-22 DIAGNOSIS — F32A Depression, unspecified: Secondary | ICD-10-CM

## 2016-08-15 ENCOUNTER — Ambulatory Visit (INDEPENDENT_AMBULATORY_CARE_PROVIDER_SITE_OTHER): Payer: Medicare HMO | Admitting: Nurse Practitioner

## 2016-08-15 ENCOUNTER — Encounter: Payer: Self-pay | Admitting: Nurse Practitioner

## 2016-08-15 VITALS — BP 129/74 | HR 72 | Temp 98.1°F | Ht 63.0 in | Wt 150.0 lb

## 2016-08-15 DIAGNOSIS — F411 Generalized anxiety disorder: Secondary | ICD-10-CM

## 2016-08-15 DIAGNOSIS — F329 Major depressive disorder, single episode, unspecified: Secondary | ICD-10-CM

## 2016-08-15 DIAGNOSIS — E785 Hyperlipidemia, unspecified: Secondary | ICD-10-CM

## 2016-08-15 DIAGNOSIS — F3342 Major depressive disorder, recurrent, in full remission: Secondary | ICD-10-CM | POA: Diagnosis not present

## 2016-08-15 DIAGNOSIS — E039 Hypothyroidism, unspecified: Secondary | ICD-10-CM

## 2016-08-15 DIAGNOSIS — I10 Essential (primary) hypertension: Secondary | ICD-10-CM | POA: Diagnosis not present

## 2016-08-15 DIAGNOSIS — F32A Depression, unspecified: Secondary | ICD-10-CM

## 2016-08-15 MED ORDER — HYDROCHLOROTHIAZIDE 25 MG PO TABS: ORAL_TABLET | ORAL | 5 refills | Status: DC

## 2016-08-15 MED ORDER — LISINOPRIL 40 MG PO TABS: ORAL_TABLET | ORAL | 5 refills | Status: DC

## 2016-08-15 MED ORDER — LEVOTHYROXINE SODIUM 75 MCG PO TABS: 75.0000 ug | ORAL_TABLET | Freq: Every day | ORAL | 5 refills | Status: DC

## 2016-08-15 MED ORDER — CITALOPRAM HYDROBROMIDE 20 MG PO TABS: ORAL_TABLET | ORAL | 5 refills | Status: DC

## 2016-08-15 MED ORDER — METOPROLOL SUCCINATE ER 100 MG PO TB24: 100.0000 mg | ORAL_TABLET | Freq: Every day | ORAL | 5 refills | Status: DC

## 2016-08-15 MED ORDER — AMLODIPINE BESYLATE 5 MG PO TABS: 5.0000 mg | ORAL_TABLET | Freq: Every day | ORAL | 6 refills | Status: DC

## 2016-08-15 NOTE — Progress Notes (Signed)
Subjective:    Patient ID: Denise Stewart, female    DOB: 12-31-1940, 76 y.o.   MRN: [id number]  HPI Denise Stewart is here today for follow up of chronic medical problem.  Outpatient Encounter Prescriptions as of 08/15/2016  Medication Sig  . amLODipine (NORVASC) 5 MG tablet Take 1 tablet (5 mg total) by mouth daily.  Marland Kitchen aspirin 81 MG tablet Take 81 mg by mouth daily.  Marland Kitchen azithromycin (ZITHROMAX) 250 MG tablet Two tablets day one, then one tablet daily next 4 days.  . calcium carbonate (OS-CAL) 600 MG TABS Take 600 mg by mouth 2 (two) times daily with a meal.  . Cholecalciferol (VITAMIN D-3) 1000 UNITS CAPS Take 1,000 Units by mouth daily.  . citalopram (CELEXA) 20 MG tablet TAKE ONE (1) TABLET EACH DAY  . clonazePAM (KLONOPIN) 0.5 MG tablet TAKE ONE TABLET TWICE DAILY AS NEEDED  . fish oil-omega-3 fatty acids 1000 MG capsule Take 2 g by mouth daily.  . hydrochlorothiazide (HYDRODIURIL) 25 MG tablet TAKE ONE (1) TABLET EACH DAY  . levothyroxine (SYNTHROID, LEVOTHROID) 75 MCG tablet Take 1 tablet (75 mcg total) by mouth daily before breakfast.  . lisinopril (PRINIVIL,ZESTRIL) 40 MG tablet TAKE ONE (1) TABLET EACH DAY  . metoprolol succinate (TOPROL-XL) 100 MG 24 hr tablet TAKE 1 TABLET DAILY WITH FOOD   No facility-administered encounter medications on file as of 08/15/2016.     1. Essential hypertension  Blood pressures being managed with amlodipine, lisinopril, HCTZ, and metoprolol.  Patient checking blood pressure at home.  2. Hypothyroidism, unspecified type  Managed with levothyroxine.  3. Hyperlipidemia, unspecified hyperlipidemia type  Patient managing with low fat diet and fish oil capsules.  4. Depression, unspecified depression type  Practicing stress management and taking celexa.  No increase in depression symptoms.  5. GAD (generalized anxiety disorder)  Patient takes clonazepam as needed for anxiety.    New complaints: Patient complains of left side pain x1 week that is  worse with movement and certain positions.  Patient states she fell from the bed to the carpet and was originally having generalized pain, but now it is just the left side.  Patient taking Tylenol which seems to help some.    Review of Systems  Constitutional: Negative for activity change, appetite change and fatigue.  Respiratory: Negative for cough and shortness of breath.   Cardiovascular: Negative for chest pain and palpitations.  Gastrointestinal: Negative for abdominal distention and abdominal pain.  Musculoskeletal: Positive for back pain (left side following a fall 1 week ago).  Neurological: Negative for dizziness and light-headedness.  All other systems reviewed and are negative.      Objective:   Physical Exam  Constitutional: She is oriented to person, place, and time. She appears well-developed and well-nourished.  HENT:  Head: Normocephalic.  Right Ear: External ear normal.  Left Ear: External ear normal.  Mouth/Throat: Oropharynx is clear and moist.  Eyes: Pupils are equal, round, and reactive to light.  Neck: Normal range of motion. Neck supple.  Cardiovascular: Normal rate, regular rhythm and normal heart sounds.   No murmur heard. Pulmonary/Chest: Effort normal and breath sounds normal.  Abdominal: Soft. Bowel sounds are normal. There is no tenderness.  Musculoskeletal: Normal range of motion. She exhibits no edema or tenderness.  Neurological: She is alert and oriented to person, place, and time.  Skin: Skin is warm and dry.  Psychiatric: She has a normal mood and affect. Her behavior is normal. Judgment and thought content normal.  BP 129/74   Pulse 72   Temp 98.1 F (36.7 C) (Oral)   Ht 5' 3"  (1.6 m)   Wt 150 lb (68 kg)   BMI 26.57 kg/m      Assessment & Plan:  1. Essential hypertension Low sodium diet - amLODipine (NORVASC) 5 MG tablet; Take 1 tablet (5 mg total) by mouth daily.  Dispense: 30 tablet; Refill: 6 - metoprolol succinate (TOPROL-XL)  100 MG 24 hr tablet; Take 1 tablet (100 mg total) by mouth daily. with food  Dispense: 30 tablet; Refill: 5 - lisinopril (PRINIVIL,ZESTRIL) 40 MG tablet; TAKE ONE (1) TABLET EACH DAY  Dispense: 30 tablet; Refill: 5 - hydrochlorothiazide (HYDRODIURIL) 25 MG tablet; TAKE ONE (1) TABLET EACH DAY  Dispense: 30 tablet; Refill: 5 - CMP14+EGFR  2. Hypothyroidism, unspecified type - levothyroxine (SYNTHROID, LEVOTHROID) 75 MCG tablet; Take 1 tablet (75 mcg total) by mouth daily before breakfast.  Dispense: 30 tablet; Refill: 5 - Thyroid Panel With TSH  3. Hyperlipidemia, unspecified hyperlipidemia type Low fat diet - Lipid panel  4. Depression, unspecified depression type Stress management - citalopram (CELEXA) 20 MG tablet; TAKE ONE (1) TABLET EACH DAY  Dispense: 30 tablet; Refill: 55. GAD (generalized anxiety disorder) stress management      Labs pending Health maintenance reviewed Diet and exercise encouraged Continue all meds Follow up  In 6 months   [place], FNP

## 2016-08-15 NOTE — Patient Instructions (Signed)
Fall Prevention in the Home Falls can cause injuries. They can happen to people of all ages. There are many things you can do to make your home safe and to help prevent falls. What can I do on the outside of my home?  Regularly fix the edges of walkways and driveways and fix any cracks.  Remove anything that might make you trip as you walk through a door, such as a raised step or threshold.  Trim any bushes or trees on the path to your home.  Use bright outdoor lighting.  Clear any walking paths of anything that might make someone trip, such as rocks or tools.  Regularly check to see if handrails are loose or broken. Make sure that both sides of any steps have handrails.  Any raised decks and porches should have guardrails on the edges.  Have any leaves, snow, or ice cleared regularly.  Use sand or salt on walking paths during winter.  Clean up any spills in your garage right away. This includes oil or grease spills. What can I do in the bathroom?  Use night lights.  Install grab bars by the toilet and in the tub and shower. Do not use towel bars as grab bars.  Use non-skid mats or decals in the tub or shower.  If you need to sit down in the shower, use a plastic, non-slip stool.  Keep the floor dry. Clean up any water that spills on the floor as soon as it happens.  Remove soap buildup in the tub or shower regularly.  Attach bath mats securely with double-sided non-slip rug tape.  Do not have throw rugs and other things on the floor that can make you trip. What can I do in the bedroom?  Use night lights.  Make sure that you have a light by your bed that is easy to reach.  Do not use any sheets or blankets that are too big for your bed. They should not hang down onto the floor.  Have a firm chair that has side arms. You can use this for support while you get dressed.  Do not have throw rugs and other things on the floor that can make you trip. What can I do in the  kitchen?  Clean up any spills right away.  Avoid walking on wet floors.  Keep items that you use a lot in easy-to-reach places.  If you need to reach something above you, use a strong step stool that has a grab bar.  Keep electrical cords out of the way.  Do not use floor polish or wax that makes floors slippery. If you must use wax, use non-skid floor wax.  Do not have throw rugs and other things on the floor that can make you trip. What can I do with my stairs?  Do not leave any items on the stairs.  Make sure that there are handrails on both sides of the stairs and use them. Fix handrails that are broken or loose. Make sure that handrails are as long as the stairways.  Check any carpeting to make sure that it is firmly attached to the stairs. Fix any carpet that is loose or worn.  Avoid having throw rugs at the top or bottom of the stairs. If you do have throw rugs, attach them to the floor with carpet tape.  Make sure that you have a light switch at the top of the stairs and the bottom of the stairs. If you do   not have them, ask someone to add them for you. What else can I do to help prevent falls?  Wear shoes that:  Do not have high heels.  Have rubber bottoms.  Are comfortable and fit you well.  Are closed at the toe. Do not wear sandals.  If you use a stepladder:  Make sure that it is fully opened. Do not climb a closed stepladder.  Make sure that both sides of the stepladder are locked into place.  Ask someone to hold it for you, if possible.  Clearly mark and make sure that you can see:  Any grab bars or handrails.  First and last steps.  Where the edge of each step is.  Use tools that help you move around (mobility aids) if they are needed. These include:  Canes.  Walkers.  Scooters.  Crutches.  Turn on the lights when you go into a dark area. Replace any light bulbs as soon as they burn out.  Set up your furniture so you have a clear path.  Avoid moving your furniture around.  If any of your floors are uneven, fix them.  If there are any pets around you, be aware of where they are.  Review your medicines with your doctor. Some medicines can make you feel dizzy. This can increase your chance of falling. Ask your doctor what other things that you can do to help prevent falls. This information is not intended to replace advice given to you by your health care provider. Make sure you discuss any questions you have with your health care provider. Document Released: 01/19/2009 Document Revised: 08/31/2015 Document Reviewed: 04/29/2014 Elsevier Interactive Patient Education  2017 [employer]  

## 2016-08-16 LAB — LIPID PANEL
Chol/HDL Ratio: 3.7 ratio (ref 0.0–4.4)
Cholesterol, Total: 116 mg/dL (ref 100–199)
HDL: 31 mg/dL — ABNORMAL LOW (ref >39)
LDL Calculated: 66 mg/dL (ref 0–99)
TRIGLYCERIDES: 96 mg/dL (ref 0–149)
VLDL Cholesterol Cal: 19 mg/dL (ref 5–40)

## 2016-08-16 LAB — CMP14+EGFR
A/G RATIO: 1.2 (ref 1.2–2.2)
ALK PHOS: 104 IU/L (ref 39–117)
ALT: 16 IU/L (ref 0–32)
AST: 14 IU/L (ref 0–40)
Albumin: 3.6 g/dL (ref 3.5–4.8)
BUN/Creatinine Ratio: 15 (ref 12–28)
BUN: 20 mg/dL (ref 8–27)
Bilirubin Total: 0.4 mg/dL (ref 0.0–1.2)
CHLORIDE: 94 mmol/L — AB (ref 96–106)
CO2: 28 mmol/L (ref 18–29)
Calcium: 8.8 mg/dL (ref 8.7–10.3)
Creatinine, Ser: 1.34 mg/dL — ABNORMAL HIGH (ref 0.57–1.00)
GFR calc Af Amer: 44 mL/min/{1.73_m2} — ABNORMAL LOW (ref >59)
GFR calc non Af Amer: 38 mL/min/{1.73_m2} — ABNORMAL LOW (ref >59)
GLOBULIN, TOTAL: 3.1 g/dL (ref 1.5–4.5)
Glucose: 90 mg/dL (ref 65–99)
POTASSIUM: 4.4 mmol/L (ref 3.5–5.2)
SODIUM: 137 mmol/L (ref 134–144)
Total Protein: 6.7 g/dL (ref 6.0–8.5)

## 2016-08-16 LAB — THYROID PANEL WITH TSH
FREE THYROXINE INDEX: 3.1 (ref 1.2–4.9)
T3 UPTAKE RATIO: 30 % (ref 24–39)
T4, Total: 10.3 ug/dL (ref 4.5–12.0)
TSH: 2.62 u[IU]/mL (ref 0.450–4.500)

## 2016-08-22 ENCOUNTER — Other Ambulatory Visit: Payer: Self-pay | Admitting: Nurse Practitioner

## 2016-08-22 DIAGNOSIS — F411 Generalized anxiety disorder: Secondary | ICD-10-CM

## 2016-08-22 NOTE — Telephone Encounter (Signed)
Please call in clonazepam with 1 refills 

## 2016-08-23 ENCOUNTER — Telehealth: Payer: Self-pay | Admitting: Nurse Practitioner

## 2016-08-23 NOTE — Telephone Encounter (Signed)
Patient aware of lab results.

## 2016-09-23 ENCOUNTER — Other Ambulatory Visit: Payer: Self-pay | Admitting: Nurse Practitioner

## 2016-09-23 DIAGNOSIS — E039 Hypothyroidism, unspecified: Secondary | ICD-10-CM

## 2016-09-23 DIAGNOSIS — F3342 Major depressive disorder, recurrent, in full remission: Secondary | ICD-10-CM

## 2016-09-23 DIAGNOSIS — I10 Essential (primary) hypertension: Secondary | ICD-10-CM

## 2016-10-08 DIAGNOSIS — H2513 Age-related nuclear cataract, bilateral: Secondary | ICD-10-CM | POA: Diagnosis not present

## 2016-10-08 DIAGNOSIS — H40033 Anatomical narrow angle, bilateral: Secondary | ICD-10-CM | POA: Diagnosis not present

## 2016-12-13 ENCOUNTER — Other Ambulatory Visit: Payer: Self-pay | Admitting: Nurse Practitioner

## 2016-12-13 DIAGNOSIS — F3342 Major depressive disorder, recurrent, in full remission: Secondary | ICD-10-CM

## 2017-02-21 ENCOUNTER — Ambulatory Visit: Payer: Medicare HMO | Admitting: Nurse Practitioner

## 2017-02-21 ENCOUNTER — Encounter: Payer: Self-pay | Admitting: Nurse Practitioner

## 2017-02-21 VITALS — BP 145/77 | HR 67 | Temp 98.1°F | Ht 63.0 in | Wt 156.0 lb

## 2017-02-21 DIAGNOSIS — I10 Essential (primary) hypertension: Secondary | ICD-10-CM

## 2017-02-21 DIAGNOSIS — F32A Depression, unspecified: Secondary | ICD-10-CM

## 2017-02-21 DIAGNOSIS — F411 Generalized anxiety disorder: Secondary | ICD-10-CM

## 2017-02-21 DIAGNOSIS — M25531 Pain in right wrist: Secondary | ICD-10-CM

## 2017-02-21 DIAGNOSIS — E039 Hypothyroidism, unspecified: Secondary | ICD-10-CM | POA: Diagnosis not present

## 2017-02-21 DIAGNOSIS — F329 Major depressive disorder, single episode, unspecified: Secondary | ICD-10-CM | POA: Diagnosis not present

## 2017-02-21 DIAGNOSIS — F3342 Major depressive disorder, recurrent, in full remission: Secondary | ICD-10-CM

## 2017-02-21 DIAGNOSIS — E785 Hyperlipidemia, unspecified: Secondary | ICD-10-CM | POA: Diagnosis not present

## 2017-02-21 MED ORDER — HYDROCHLOROTHIAZIDE 25 MG PO TABS: ORAL_TABLET | ORAL | 1 refills | Status: DC

## 2017-02-21 MED ORDER — METOPROLOL SUCCINATE ER 100 MG PO TB24: 100.0000 mg | ORAL_TABLET | Freq: Every day | ORAL | 1 refills | Status: DC

## 2017-02-21 MED ORDER — LISINOPRIL 40 MG PO TABS: ORAL_TABLET | ORAL | 1 refills | Status: DC

## 2017-02-21 MED ORDER — AMLODIPINE BESYLATE 5 MG PO TABS: ORAL_TABLET | ORAL | 1 refills | Status: DC

## 2017-02-21 MED ORDER — CITALOPRAM HYDROBROMIDE 20 MG PO TABS: ORAL_TABLET | ORAL | 1 refills | Status: DC

## 2017-02-21 MED ORDER — CLONAZEPAM 0.5 MG PO TABS: 0.5000 mg | ORAL_TABLET | Freq: Two times a day (BID) | ORAL | 2 refills | Status: DC

## 2017-02-21 NOTE — Progress Notes (Signed)
Subjective:    Patient ID: Denise Stewart, female    DOB: [birth year], 76 y.o.   MRN: [id number]  HPI  Denise Stewart is here today for follow up of chronic medical problem.  Outpatient Encounter Medications as of 02/21/2017  Medication Sig  . amLODipine (NORVASC) 5 MG tablet TAKE ONE (1) TABLET EACH DAY  . aspirin 81 MG tablet Take 81 mg by mouth daily.  . calcium carbonate (OS-CAL) 600 MG TABS Take 600 mg by mouth 2 (two) times daily with a meal.  . Cholecalciferol (VITAMIN D-3) 1000 UNITS CAPS Take 1,000 Units by mouth daily.  . citalopram (CELEXA) 20 MG tablet TAKE ONE (1) TABLET EACH DAY  . clonazePAM (KLONOPIN) 0.5 MG tablet TAKE ONE TABLET BY MOUTH TWICE DAILY  . fish oil-omega-3 fatty acids 1000 MG capsule Take 2 g by mouth daily.  . hydrochlorothiazide (HYDRODIURIL) 25 MG tablet TAKE ONE (1) TABLET EACH DAY  . levothyroxine (SYNTHROID, LEVOTHROID) 75 MCG tablet TAKE ONE TABLET EACH MORNING BEFORE BREAKFAST  . lisinopril (PRINIVIL,ZESTRIL) 40 MG tablet TAKE ONE (1) TABLET EACH DAY  . metoprolol succinate (TOPROL-XL) 100 MG 24 hr tablet TAKE 1 TABLET DAILY WITH FOOD   No facility-administered encounter medications on file as of 02/21/2017.     1. Essential hypertension  No c/o chest pain, sob or headache. Does not check blood pressures at home. BP Readings from Last 3 Encounters:  08/15/16 129/74  04/23/16 136/76  10/20/15 139/71     2. Hypothyroidism, unspecified type  No problems that she is aware of  3. Hyperlipidemia, unspecified hyperlipidemia type  Not watching diet- no exercise  4. Depression, unspecified depression type  Patient is on celexa- she says it is working well for her. She denies any medication side effects,  5. GAD (generalized anxiety disorder)  Patient is a very anxious person. She has been on klonopin 2x a day for awhile. She is not able to relax at all if she does not take her meds.    New complaints: Patient fell while working in her flower  garden and she caught herself with her right wrist. Happened 1 week ago.  Social history: Lives with husband    Review of Systems  Constitutional: Negative for activity change and appetite change.  HENT: Negative.   Eyes: Negative for pain.  Respiratory: Negative for shortness of breath.   Cardiovascular: Negative for chest pain, palpitations and leg swelling.  Gastrointestinal: Negative for abdominal pain.  Endocrine: Negative for polydipsia.  Genitourinary: Negative.   Skin: Negative for rash.  Neurological: Negative for dizziness, weakness and headaches.  Hematological: Does not bruise/bleed easily.  Psychiatric/Behavioral: Negative.   All other systems reviewed and are negative.      Objective:   Physical Exam  Constitutional: She is oriented to person, place, and time. She appears well-developed and well-nourished.  HENT:  Nose: Nose normal.  Mouth/Throat: Oropharynx is clear and moist.  Eyes: EOM are normal.  Neck: Trachea normal, normal range of motion and full passive range of motion without pain. Neck supple. No JVD present. Carotid bruit is not present. No thyromegaly present.  Cardiovascular: Normal rate, regular rhythm, normal heart sounds and intact distal pulses. Exam reveals no gallop and no friction rub.  No murmur heard. Pulmonary/Chest: Effort normal and breath sounds normal.  Abdominal: Soft. Bowel sounds are normal. She exhibits no distension and no mass. There is no tenderness.  Musculoskeletal: Normal range of motion.  Mild tenderness on palpation of right wrist FROM  of right wrist without pain Grips equal bil  Lymphadenopathy:    She has no cervical adenopathy.  Neurological: She is alert and oriented to person, place, and time. She has normal reflexes.  Skin: Skin is warm and dry.  Psychiatric: She has a normal mood and affect. Her behavior is normal. Judgment and thought content normal.   BP (!) 145/77   Pulse 67   Temp 98.1 F (36.7 C) (Oral)    Ht 5' 3"  (1.6 m)   Wt 156 lb (70.8 kg)   BMI 27.63 kg/m       Assessment & Plan:  1. Essential hypertension Low sodium diet - CMP14+EGFR - metoprolol succinate (TOPROL-XL) 100 MG 24 hr tablet; Take 1 tablet (100 mg total) daily by mouth. with food  Dispense: 90 tablet; Refill: 1 - lisinopril (PRINIVIL,ZESTRIL) 40 MG tablet; TAKE ONE (1) TABLET EACH DAY  Dispense: 90 tablet; Refill: 1 - hydrochlorothiazide (HYDRODIURIL) 25 MG tablet; TAKE ONE (1) TABLET EACH DAY  Dispense: 90 tablet; Refill: 1 - amLODipine (NORVASC) 5 MG tablet; TAKE ONE (1) TABLET EACH DAY  Dispense: 90 tablet; Refill: 1  2. Hypothyroidism, unspecified type  3. Hyperlipidemia, unspecified hyperlipidemia type Low fat diet - Lipid panel  4. Recurrent major depressive disorder, in full remission (Denise Stewart) Stress management - citalopram (CELEXA) 20 MG tablet; TAKE ONE (1) TABLET EACH DAY  Dispense: 90 tablet; Refill: 1  5. GAD (generalized anxiety disorder) - clonazePAM (KLONOPIN) 0.5 MG tablet; Take 1 tablet (0.5 mg total) 2 (two) times daily by mouth.  Dispense: 60 tablet; Refill: 2   6. Right wrist pain/ sprain Wrap when using Ice if needed    Labs pending Health maintenance reviewed Diet and exercise encouraged Continue all meds Follow up  In 3 months   [place], FNP

## 2017-02-21 NOTE — Patient Instructions (Signed)
RICE for Routine Care of Injuries Many injuries can be cared for using rest, ice, compression, and elevation (RICE therapy). Using RICE therapy can help to lessen pain and swelling. It can help your body to heal. Rest Reduce your normal activities and avoid using the injured part of your body. You can go back to your normal activities when you feel okay and your doctor says it is okay. Ice Do not put ice on your bare skin.  Put ice in a plastic bag.  Place a towel between your skin and the bag.  Leave the ice on for 20 minutes, 2-3 times a day.  Do this for as long as told by your doctor. Compression Compression means putting pressure on the injured area. This can be done with an elastic bandage. If an elastic bandage has been applied:  Remove and reapply the bandage every 3-4 hours or as told by your doctor.  Make sure the bandage is not wrapped too tight. Wrap the bandage more loosely if part of your body beyond the bandage is blue, swollen, cold, painful, or loses feeling (numb).  See your doctor if the bandage seems to make your problems worse.  Elevation Elevation means keeping the injured area raised. Raise the injured area above your heart or the center of your chest if you can. When should I get help? You should get help if:  You keep having pain and swelling.  Your symptoms get worse.  Get help right away if: You should get help right away if:  You have sudden bad pain at or below the area of your injury.  You have redness or more swelling around your injury.  You have tingling or numbness at or below the injury that does not go away when you take off the bandage.  This information is not intended to replace advice given to you by your health care provider. Make sure you discuss any questions you have with your health care provider. Document Released: 09/11/2007 Document Revised: 02/20/2016 Document Reviewed: 03/02/2014 Elsevier Interactive Patient Education  2017  Elsevier Inc.  

## 2017-02-22 LAB — CMP14+EGFR
ALK PHOS: 86 IU/L (ref 39–117)
ALT: 8 IU/L (ref 0–32)
AST: 17 IU/L (ref 0–40)
Albumin/Globulin Ratio: 1.6 (ref 1.2–2.2)
Albumin: 4.1 g/dL (ref 3.5–4.8)
BUN/Creatinine Ratio: 14 (ref 12–28)
BUN: 17 mg/dL (ref 8–27)
Bilirubin Total: 0.6 mg/dL (ref 0.0–1.2)
CALCIUM: 9 mg/dL (ref 8.7–10.3)
CO2: 25 mmol/L (ref 20–29)
CREATININE: 1.19 mg/dL — AB (ref 0.57–1.00)
Chloride: 102 mmol/L (ref 96–106)
GFR calc Af Amer: 51 mL/min/{1.73_m2} — ABNORMAL LOW (ref >59)
GFR, EST NON AFRICAN AMERICAN: 44 mL/min/{1.73_m2} — AB (ref >59)
Globulin, Total: 2.6 g/dL (ref 1.5–4.5)
Glucose: 102 mg/dL — ABNORMAL HIGH (ref 65–99)
POTASSIUM: 4.3 mmol/L (ref 3.5–5.2)
Sodium: 142 mmol/L (ref 134–144)
Total Protein: 6.7 g/dL (ref 6.0–8.5)

## 2017-02-22 LAB — LIPID PANEL
CHOL/HDL RATIO: 3.7 ratio (ref 0.0–4.4)
CHOLESTEROL TOTAL: 146 mg/dL (ref 100–199)
HDL: 40 mg/dL (ref >39)
LDL CALC: 84 mg/dL (ref 0–99)
TRIGLYCERIDES: 110 mg/dL (ref 0–149)
VLDL Cholesterol Cal: 22 mg/dL (ref 5–40)

## 2017-03-05 ENCOUNTER — Other Ambulatory Visit: Payer: Self-pay | Admitting: Nurse Practitioner

## 2017-03-05 ENCOUNTER — Telehealth: Payer: Self-pay | Admitting: Nurse Practitioner

## 2017-03-05 DIAGNOSIS — F411 Generalized anxiety disorder: Secondary | ICD-10-CM

## 2017-03-05 NOTE — Telephone Encounter (Signed)
Pt aware of lab results 

## 2017-05-05 ENCOUNTER — Telehealth: Payer: Self-pay | Admitting: Nurse Practitioner

## 2017-05-13 ENCOUNTER — Ambulatory Visit (INDEPENDENT_AMBULATORY_CARE_PROVIDER_SITE_OTHER): Payer: Medicare HMO | Admitting: *Deleted

## 2017-05-13 VITALS — BP 128/71 | HR 63 | Temp 97.7°F | Ht 63.0 in | Wt 154.0 lb

## 2017-05-13 DIAGNOSIS — Z0000 Encounter for general adult medical examination without abnormal findings: Secondary | ICD-10-CM | POA: Diagnosis not present

## 2017-05-13 NOTE — Progress Notes (Signed)
Subjective:   Denise Stewart is a 77 y.o. female who presents for Medicare Annual (Subsequent) preventive examination. She is retired from [employer]. She enjoys puzzles, word search and gardening. For exercise, she states she walks and does yard work. She says that her diet is semi-healthy and she gets in 3-4 small meals a day. She attends church, but is not active in the community. She is widowed, as her husband passed away in a MVA in 62. She lives at home alone and she has 5 cats. We did discuss fall risks and hazards today. She has 2 children, 2 grandchildren and 5 great-grandchildren. She states that her health is about the same as it was a year ago.   Cardiac Risk Factors include: advanced age (>85men, >49 women);hypertension     Objective:     Vitals: BP 128/71 (BP Location: Right Arm)   Pulse 63   Temp 97.7 F (36.5 C) (Oral)   Ht 5\' 3"  (1.6 m)   Wt 154 lb (69.9 kg)   BMI 27.28 kg/m   Body mass index is 27.28 kg/m.  Advanced Directives 05/13/2017 03/18/2014  Does Patient Have a Medical Advance Directive? No No  Would patient like information on creating a medical advance directive? Yes (MAU/Ambulatory/Procedural Areas - Information given) Yes - Educational materials given    Tobacco Social History   Tobacco Use  Smoking Status Current Some Day Smoker  . Packs/day: 0.50  . Types: Cigarettes  . Start date: 04/08/1953  Smokeless Tobacco Never Used     Ready to quit: Not Answered Counseling given: Not Answered   Clinical Intake:                       Past Medical History:  Diagnosis Date  . Depression   . GAD (generalized anxiety disorder)   . Hyperlipidemia   . Hypertension   . Thyroid disease    Past Surgical History:  Procedure Laterality Date  . EYE SURGERY Right 1970   right -UNDER eye lesion removed    Family History  Problem Relation Age of Onset  . Hypertension Mother   . Heart attack Mother 75  . Heart disease Mother   .  Clotting disorder Father 100  . Stroke Brother   . Suicidality Sister   . Other Sister        train accident  . Cancer Sister        liver cancer  . Diabetes Brother   . Other Brother        storm accident  . Diabetes Brother        leg removed   . Cancer Brother        lung  . Cancer Brother        lymphoma   Social History   Socioeconomic History  . Marital status: Widowed    Spouse name: None  . Number of children: None  . Years of education: None  . Highest education level: None  Social Needs  . Financial resource strain: None  . Food insecurity - worry: None  . Food insecurity - inability: None  . Transportation needs - medical: None  . Transportation needs - non-medical: None  Occupational History  . Occupation: retired    Associate Professor: Patent examiner  Tobacco Use  . Smoking status: Current Some Day Smoker    Packs/day: 0.50    Types: Cigarettes    Start date: 04/08/1953  . Smokeless tobacco: Never Used  Substance and Sexual Activity  . Alcohol use: No  . Drug use: No  . Sexual activity: None  Other Topics Concern  . None  Social History Narrative  . None    Outpatient Encounter Medications as of 05/13/2017  Medication Sig  . amLODipine (NORVASC) 5 MG tablet TAKE ONE (1) TABLET EACH DAY  . aspirin 81 MG tablet Take 81 mg by mouth daily.  . calcium carbonate (OS-CAL) 600 MG TABS Take 600 mg by mouth 2 (two) times daily with a meal.  . Cholecalciferol (VITAMIN D-3) 1000 UNITS CAPS Take 1,000 Units by mouth daily.  . citalopram (CELEXA) 20 MG tablet TAKE ONE (1) TABLET EACH DAY  . clonazePAM (KLONOPIN) 0.5 MG tablet Take 1 tablet (0.5 mg total) 2 (two) times daily by mouth.  . fish oil-omega-3 fatty acids 1000 MG capsule Take 2 g by mouth daily.  . hydrochlorothiazide (HYDRODIURIL) 25 MG tablet TAKE ONE (1) TABLET EACH DAY  . levothyroxine (SYNTHROID, LEVOTHROID) 75 MCG tablet TAKE ONE TABLET EACH MORNING BEFORE BREAKFAST  . lisinopril (PRINIVIL,ZESTRIL) 40  MG tablet TAKE ONE (1) TABLET EACH DAY  . metoprolol succinate (TOPROL-XL) 100 MG 24 hr tablet Take 1 tablet (100 mg total) daily by mouth. with food   No facility-administered encounter medications on file as of 05/13/2017.     Activities of Daily Living In your present state of health, do you have any difficulty performing the following activities: 05/13/2017  Hearing? N  Vision? Y  Comment cataracts need to be removed  / driving glasses   Difficulty concentrating or making decisions? N  Walking or climbing stairs? N  Dressing or bathing? N  Doing errands, shopping? N  Preparing Food and eating ? N  Using the Toilet? N  In the past six months, have you accidently leaked urine? N  Do you have problems with loss of bowel control? N  Managing your Medications? N  Managing your Finances? N  Housekeeping or managing your Housekeeping? N  Some recent data might be hidden    Patient Care Team: Bennie PieriniMartin, Mary-Margaret, FNP as PCP - General (Nurse Practitioner)    Assessment:   This is a routine wellness examination for [place] [facility].  Exercise Activities and Dietary recommendations Current Exercise Habits: Home exercise routine, Type of exercise: walking;Other - see comments(yard and gardening ), Time (Minutes): 20, Frequency (Times/Week): 5, Weekly Exercise (Minutes/Week): 100, Intensity: Mild, Exercise limited by: None identified  Goals    . Increase physical activity    . Prevent falls     Wants to clean house       Fall Risk Fall Risk  05/13/2017 02/21/2017 08/15/2016 04/23/2016 10/20/2015  Falls in the past year? Yes Yes Yes No No  Number falls in past yr: 1 1 1  - -  Injury with Fall? No Yes No - -  Comment right wrist  Wrist pain - - -   Is the patient's home free of loose throw rugs in walkways, pet beds, electrical cords, etc?  We discussed fall and tripping hazards today  Depression Screen PHQ 2/9 Scores 05/13/2017 02/21/2017 08/15/2016 04/23/2016  PHQ - 2 Score 0 0 0 0  PHQ- 9  Score - - - -     Cognitive Function MMSE - Mini Mental State Exam 05/13/2017  Orientation to time 5  Orientation to Place 5  Registration 3  Attention/ Calculation 5  Recall 3  Language- name 2 objects 2  Language- repeat 1  Language- follow 3 step command  2  Language- read & follow direction 1  Write a sentence 1  Copy design 1  Total score 29        Immunization History  Administered Date(s) Administered  . Pneumococcal Conjugate-13 08/29/2014  . Pneumococcal Polysaccharide-23 10/20/2015    Qualifies for Shingles Vaccine? Pt denied today  Screening Tests Health Maintenance  Topic Date Due  . INFLUENZA VACCINE  07/17/2017 (Originally 11/06/2016)  . MAMMOGRAM  05/13/2018 (Originally 09/19/1956)  . TETANUS/TDAP  05/13/2018 (Originally 09/19/1957)  . DEXA SCAN  Completed  . PNA vac Low Risk Adult  Completed   Pt is overdue for several health maintainence areas today - but declines   Cancer Screenings: Lung: Low Dose CT Chest recommended if Age 68-80 years, 30 pack-year currently smoking OR have quit w/in 15years. Patient does qualify. Breast:  Up to date on Mammogram? No  Declines  Up to date of Bone Density/Dexa? No declines  Colorectal: needs / declines   Additional Screenings: Hepatitis B/HIV/Syphillis: Hepatitis C Screening:  Declined     Plan:   pt will keep a every 6 mos follow up with MMM  She declines several health maintenance area today : overdue for Mammo, DEXA, CXR and may need EKG (if indicated) Tdap was discussed today and declined until needed. Pt is due Mid-May for appt - and prefers to call to set that up at a later time.  I have personally reviewed and noted the following in the patient's chart:   . Medical and social history . Use of alcohol, tobacco or illicit drugs  . Current medications and supplements . Functional ability and status . Nutritional status . Physical activity . Advanced directives . List of other  physicians . Hospitalizations, surgeries, and ER visits in previous 12 months . Vitals . Screenings to include cognitive, depression, and falls . Referrals and appointments  In addition, I have reviewed and discussed with patient certain preventive protocols, quality metrics, and best practice recommendations. A written personalized care plan for preventive services as well as general preventive health recommendations were provided to patient.     Prisca Gearing, Almond Lint, LPN  4/0/9811  I have reviewed and agree with the above AWV documentation.   Mary-Margaret Daphine Deutscher, FNP

## 2017-05-13 NOTE — Patient Instructions (Signed)
  Ms. Denise Stewart , Thank you for taking time to come for your Medicare Wellness Visit. I appreciate your ongoing commitment to your health goals. Please review the following plan we discussed and let me know if I can assist you in the future.   These are the goals we discussed: Goals    . Increase physical activity    . Prevent falls     Wants to clean house       This is a list of the screening recommended for you and due dates:  Health Maintenance  Topic Date Due  . Flu Shot  07/17/2017*  . Mammogram  05/13/2018*  . Tetanus Vaccine  05/13/2018*  . DEXA scan (bone density measurement)  Completed  . Pneumonia vaccines  Completed  *Topic was postponed. The date shown is not the original due date.     Keep follow up with Denise Stewart and other specialist Make sure to call and schedule an appt around Mid- MAY 2019 Review the advanced directives with your family / attorney.

## 2017-06-23 ENCOUNTER — Other Ambulatory Visit: Payer: Self-pay | Admitting: Nurse Practitioner

## 2017-06-23 DIAGNOSIS — E039 Hypothyroidism, unspecified: Secondary | ICD-10-CM

## 2017-09-04 ENCOUNTER — Ambulatory Visit (INDEPENDENT_AMBULATORY_CARE_PROVIDER_SITE_OTHER): Payer: Medicare HMO

## 2017-09-04 ENCOUNTER — Ambulatory Visit (INDEPENDENT_AMBULATORY_CARE_PROVIDER_SITE_OTHER): Payer: Medicare HMO | Admitting: Nurse Practitioner

## 2017-09-04 ENCOUNTER — Encounter: Payer: Self-pay | Admitting: Nurse Practitioner

## 2017-09-04 VITALS — BP 132/79 | HR 71 | Temp 97.0°F | Ht 63.0 in | Wt 158.0 lb

## 2017-09-04 DIAGNOSIS — Z6827 Body mass index (BMI) 27.0-27.9, adult: Secondary | ICD-10-CM | POA: Insufficient documentation

## 2017-09-04 DIAGNOSIS — F3342 Major depressive disorder, recurrent, in full remission: Secondary | ICD-10-CM | POA: Diagnosis not present

## 2017-09-04 DIAGNOSIS — G8929 Other chronic pain: Secondary | ICD-10-CM | POA: Diagnosis not present

## 2017-09-04 DIAGNOSIS — F411 Generalized anxiety disorder: Secondary | ICD-10-CM | POA: Diagnosis not present

## 2017-09-04 DIAGNOSIS — I10 Essential (primary) hypertension: Secondary | ICD-10-CM | POA: Diagnosis not present

## 2017-09-04 DIAGNOSIS — M25562 Pain in left knee: Secondary | ICD-10-CM

## 2017-09-04 DIAGNOSIS — M25561 Pain in right knee: Secondary | ICD-10-CM

## 2017-09-04 DIAGNOSIS — E039 Hypothyroidism, unspecified: Secondary | ICD-10-CM

## 2017-09-04 DIAGNOSIS — E785 Hyperlipidemia, unspecified: Secondary | ICD-10-CM | POA: Diagnosis not present

## 2017-09-04 DIAGNOSIS — M1712 Unilateral primary osteoarthritis, left knee: Secondary | ICD-10-CM | POA: Diagnosis not present

## 2017-09-04 DIAGNOSIS — M1711 Unilateral primary osteoarthritis, right knee: Secondary | ICD-10-CM | POA: Diagnosis not present

## 2017-09-04 MED ORDER — LEVOTHYROXINE SODIUM 75 MCG PO TABS: ORAL_TABLET | ORAL | 1 refills | Status: DC

## 2017-09-04 MED ORDER — HYDROCHLOROTHIAZIDE 25 MG PO TABS: ORAL_TABLET | ORAL | 1 refills | Status: DC

## 2017-09-04 MED ORDER — CLONAZEPAM 0.5 MG PO TABS: 0.5000 mg | ORAL_TABLET | Freq: Two times a day (BID) | ORAL | 2 refills | Status: DC

## 2017-09-04 MED ORDER — LISINOPRIL 40 MG PO TABS: ORAL_TABLET | ORAL | 1 refills | Status: DC

## 2017-09-04 MED ORDER — MELOXICAM 15 MG PO TABS: 15.0000 mg | ORAL_TABLET | Freq: Every day | ORAL | 0 refills | Status: DC

## 2017-09-04 MED ORDER — AMLODIPINE BESYLATE 5 MG PO TABS: ORAL_TABLET | ORAL | 1 refills | Status: DC

## 2017-09-04 MED ORDER — METOPROLOL SUCCINATE ER 100 MG PO TB24: 100.0000 mg | ORAL_TABLET | Freq: Every day | ORAL | 1 refills | Status: DC

## 2017-09-04 MED ORDER — CITALOPRAM HYDROBROMIDE 20 MG PO TABS: ORAL_TABLET | ORAL | 1 refills | Status: DC

## 2017-09-04 NOTE — Patient Instructions (Signed)

## 2017-09-04 NOTE — Progress Notes (Signed)
Subjective:    Patient ID: Denise Stewart, female    DOB: 1940-05-07, 77 y.o.   MRN: [id number]   Chief Complaint: Medical management of chronic issues  HPI:  1. Essential hypertension  No c/o chest pain, sob or headache. Does not check bloodpressures at home. BP Readings from Last 3 Encounters:  05/13/17 128/71  02/21/17 (!) 145/77  08/15/16 129/74     2. Hypothyroidism, unspecified type  Not having any problems that she is aware of  3. Hyperlipidemia, unspecified hyperlipidemia type  Does not watch diet at all. No exercise.  4. GAD (generalized anxiety disorder)  Is on klonopin bid and gets very anxious if she does not take. Has been on this for years GAD 7 : Generalized Anxiety Score 09/04/2017  Nervous, Anxious, on Edge 0  Control/stop worrying 2  Worry too much - different things 2  Trouble relaxing 0  Restless 0  Easily annoyed or irritable 0  Afraid - awful might happen 0  Total GAD 7 Score 4      5. Depression, unspecified depression type  Is currently not on anti depressant. Depression screen [facility] 2/9 09/04/2017 05/13/2017 02/21/2017  Decreased Interest 1 0 0  Down, Depressed, Hopeless 1 0 0  PHQ - 2 Score 2 0 0  Altered sleeping 0 - -  Tired, decreased energy 1 - -  Change in appetite 0 - -  Feeling bad or failure about yourself  0 - -  Trouble concentrating 0 - -  Moving slowly or fidgety/restless 0 - -  Suicidal thoughts 0 - -  PHQ-9 Score 3 - -  Difficult doing work/chores - - -     6.      BMI 27-27.9          No recent weigh tchanges  Outpatient Encounter Medications as of 09/04/2017  Medication Sig  . amLODipine (NORVASC) 5 MG tablet TAKE ONE (1) TABLET EACH DAY  . aspirin 81 MG tablet Take 81 mg by mouth daily.  . calcium carbonate (OS-CAL) 600 MG TABS Take 600 mg by mouth 2 (two) times daily with a meal.  . Cholecalciferol (VITAMIN D-3) 1000 UNITS CAPS Take 1,000 Units by mouth daily.  . citalopram (CELEXA) 20 MG tablet TAKE ONE (1) TABLET  EACH DAY  . clonazePAM (KLONOPIN) 0.5 MG tablet Take 1 tablet (0.5 mg total) 2 (two) times daily by mouth.  . fish oil-omega-3 fatty acids 1000 MG capsule Take 2 g by mouth daily.  . hydrochlorothiazide (HYDRODIURIL) 25 MG tablet TAKE ONE (1) TABLET EACH DAY  . levothyroxine (SYNTHROID, LEVOTHROID) 75 MCG tablet TAKE ONE TABLET EACH MORNING BEFORE BREAKFAST  . lisinopril (PRINIVIL,ZESTRIL) 40 MG tablet TAKE ONE (1) TABLET EACH DAY  . metoprolol succinate (TOPROL-XL) 100 MG 24 hr tablet Take 1 tablet (100 mg total) daily by mouth. with food     New complaints: C/O bil knee pain- started several months ago- pain is worse with walking and standing. Rising from sitting to standing hurts the worst.  Social history: Lives by herself and has frequent visitors. denies having medical alert system in her home in case she has an emergency    Review of Systems  Constitutional: Negative for activity change and appetite change.  HENT: Negative.   Eyes: Negative for pain.  Respiratory: Negative for shortness of breath.   Cardiovascular: Negative for chest pain, palpitations and leg swelling.  Gastrointestinal: Negative for abdominal pain.  Endocrine: Negative for polydipsia.  Genitourinary: Negative.  Skin: Negative for rash.  Neurological: Negative for dizziness, weakness and headaches.  Hematological: Does not bruise/bleed easily.  Psychiatric/Behavioral: Negative.   All other systems reviewed and are negative.      Objective:   Physical Exam  Constitutional: She is oriented to person, place, and time. She appears well-developed and well-nourished. No distress.  HENT:  Head: Normocephalic.  Nose: Nose normal.  Mouth/Throat: Oropharynx is clear and moist.  Eyes: Pupils are equal, round, and reactive to light. EOM are normal.  Neck: Normal range of motion. Neck supple. No JVD present. Carotid bruit is not present.  Cardiovascular: Normal rate, regular rhythm, normal heart sounds and  intact distal pulses.  Pulmonary/Chest: Effort normal and breath sounds normal. No respiratory distress. She has no wheezes. She has no rales. She exhibits no tenderness.  Abdominal: Soft. Normal appearance, normal aorta and bowel sounds are normal. She exhibits no distension, no abdominal bruit, no pulsatile midline mass and no mass. There is no splenomegaly or hepatomegaly. There is no tenderness.  Musculoskeletal: Normal range of motion. She exhibits no edema.  FROM of bil knees with crepitus on flexion and extension No effusion noted  Lymphadenopathy:    She has no cervical adenopathy.  Neurological: She is alert and oriented to person, place, and time. She has normal reflexes.  Skin: Skin is warm and dry.  Psychiatric: She has a normal mood and affect. Her behavior is normal. Judgment and thought content normal.     BP 132/79   Pulse 71   Temp (!) 97 F (36.1 C) (Oral)   Ht '5\' 3"'$  (1.6 m)   Wt 158 lb (71.7 kg)   BMI 27.99 kg/m   knee xray- osteoarthrits of bil knees- Preliminary reading by Ronnald Collum, FNP  [facility]     Assessment & Plan:  Denise Stewart comes in today with chief complaint of Medical Management of Chronic Issues (Right wrist pain and leg pain)   Diagnosis and orders addressed:  1. Essential hypertension Low sodium diet - metoprolol succinate (TOPROL-XL) 100 MG 24 hr tablet; Take 1 tablet (100 mg total) by mouth daily. with food  Dispense: 90 tablet; Refill: 1 - lisinopril (PRINIVIL,ZESTRIL) 40 MG tablet; TAKE ONE (1) TABLET EACH DAY  Dispense: 90 tablet; Refill: 1 - hydrochlorothiazide (HYDRODIURIL) 25 MG tablet; TAKE ONE (1) TABLET EACH DAY  Dispense: 90 tablet; Refill: 1 - amLODipine (NORVASC) 5 MG tablet; TAKE ONE (1) TABLET EACH DAY  Dispense: 90 tablet; Refill: 1 - CMP14+EGFR  2. Hypothyroidism, unspecified type - levothyroxine (SYNTHROID, LEVOTHROID) 75 MCG tablet; TAKE ONE TABLET EACH MORNING BEFORE BREAKFAST  Dispense: 90 tablet; Refill: 1 - Thyroid  Panel With TSH  3. Hyperlipidemia, unspecified hyperlipidemia type Low fat diet - Lipid panel  4. GAD (generalized anxiety disorder) stress management - clonazePAM (KLONOPIN) 0.5 MG tablet; Take 1 tablet (0.5 mg total) by mouth 2 (two) times daily.  Dispense: 60 tablet; Refill: 2  5. BMI 27.0-27.9,adult Discussed diet and exercise for person with BMI >25 Will recheck weight in 3-6 months  6. Chronic pain of both knees Rest Ice bil - DG Knee Bilateral Standing AP; Future - meloxicam (MOBIC) 15 MG tablet; Take 1 tablet (15 mg total) by mouth daily.  Dispense: 30 tablet; Refill: 0  7. Recurrent major depressive disorder, in full remission ([place]) Stress management - citalopram (CELEXA) 20 MG tablet; TAKE ONE (1) TABLET EACH DAY  Dispense: 90 tablet; Refill: 1   Labs pending Health Maintenance reviewed Diet and exercise encouraged  Follow up plan: 6 months   Mary-Margaret Hassell Done, FNP

## 2017-09-05 LAB — CMP14+EGFR
ALBUMIN: 4.2 g/dL (ref 3.5–4.8)
ALK PHOS: 90 IU/L (ref 39–117)
ALT: 10 IU/L (ref 0–32)
AST: 14 IU/L (ref 0–40)
Albumin/Globulin Ratio: 1.6 (ref 1.2–2.2)
BUN / CREAT RATIO: 13 (ref 12–28)
BUN: 16 mg/dL (ref 8–27)
Bilirubin Total: 0.5 mg/dL (ref 0.0–1.2)
CO2: 22 mmol/L (ref 20–29)
CREATININE: 1.21 mg/dL — AB (ref 0.57–1.00)
Calcium: 9.1 mg/dL (ref 8.7–10.3)
Chloride: 100 mmol/L (ref 96–106)
GFR calc non Af Amer: 43 mL/min/{1.73_m2} — ABNORMAL LOW (ref >59)
GFR, EST AFRICAN AMERICAN: 50 mL/min/{1.73_m2} — AB (ref >59)
GLUCOSE: 89 mg/dL (ref 65–99)
Globulin, Total: 2.7 g/dL (ref 1.5–4.5)
Potassium: 4.2 mmol/L (ref 3.5–5.2)
Sodium: 140 mmol/L (ref 134–144)
TOTAL PROTEIN: 6.9 g/dL (ref 6.0–8.5)

## 2017-09-05 LAB — LIPID PANEL
CHOLESTEROL TOTAL: 150 mg/dL (ref 100–199)
Chol/HDL Ratio: 3.9 ratio (ref 0.0–4.4)
HDL: 38 mg/dL — AB (ref >39)
LDL CALC: 82 mg/dL (ref 0–99)
Triglycerides: 151 mg/dL — ABNORMAL HIGH (ref 0–149)
VLDL CHOLESTEROL CAL: 30 mg/dL (ref 5–40)

## 2017-09-05 LAB — THYROID PANEL WITH TSH
Free Thyroxine Index: 2.4 (ref 1.2–4.9)
T3 Uptake Ratio: 25 % (ref 24–39)
T4 TOTAL: 9.6 ug/dL (ref 4.5–12.0)
TSH: 2.44 u[IU]/mL (ref 0.450–4.500)

## 2018-02-02 ENCOUNTER — Telehealth: Payer: Self-pay | Admitting: Nurse Practitioner

## 2018-02-03 ENCOUNTER — Ambulatory Visit (INDEPENDENT_AMBULATORY_CARE_PROVIDER_SITE_OTHER): Payer: Medicare HMO | Admitting: Family

## 2018-02-03 ENCOUNTER — Ambulatory Visit (INDEPENDENT_AMBULATORY_CARE_PROVIDER_SITE_OTHER): Payer: Medicare HMO

## 2018-02-03 ENCOUNTER — Encounter: Payer: Self-pay | Admitting: Family

## 2018-02-03 VITALS — BP 136/77 | HR 67 | Temp 97.6°F | Ht 63.0 in | Wt 156.8 lb

## 2018-02-03 DIAGNOSIS — F17200 Nicotine dependence, unspecified, uncomplicated: Secondary | ICD-10-CM | POA: Diagnosis not present

## 2018-02-03 DIAGNOSIS — R200 Anesthesia of skin: Secondary | ICD-10-CM

## 2018-02-03 DIAGNOSIS — G629 Polyneuropathy, unspecified: Secondary | ICD-10-CM

## 2018-02-03 DIAGNOSIS — M5137 Other intervertebral disc degeneration, lumbosacral region: Secondary | ICD-10-CM | POA: Diagnosis not present

## 2018-02-03 DIAGNOSIS — R202 Paresthesia of skin: Secondary | ICD-10-CM | POA: Diagnosis not present

## 2018-02-03 MED ORDER — GABAPENTIN 100 MG PO CAPS: 100.0000 mg | ORAL_CAPSULE | Freq: Three times a day (TID) | ORAL | 3 refills | Status: DC

## 2018-02-03 NOTE — Patient Instructions (Signed)
Neuropathic Pain Neuropathic pain is pain caused by damage to the nerves that are responsible for certain sensations in your body (sensory nerves). The pain can be caused by damage to:  The sensory nerves that send signals to your spinal cord and brain (peripheral nervous system).  The sensory nerves in your brain or spinal cord (central nervous system).  Neuropathic pain can make you more sensitive to pain. What would be a minor sensation for most people may feel very painful if you have neuropathic pain. This is usually a long-term condition that can be difficult to treat. The type of pain can differ from person to person. It may start suddenly (acute), or it may develop slowly and last for a long time (chronic). Neuropathic pain may come and go as damaged nerves heal or may stay at the same level for years. It often causes emotional distress, loss of sleep, and a lower quality of life. What are the causes? The most common cause of damage to a sensory nerve is diabetes. Many other diseases and conditions can also cause neuropathic pain. Causes of neuropathic pain can be classified as:  Toxic. Many drugs and chemicals can cause toxic damage. The most common cause of toxic neuropathic pain is damage from drug treatment for cancer (chemotherapy).  Metabolic. This type of pain can happen when a disease causes imbalances that damage nerves. Diabetes is the most common of these diseases. Vitamin B deficiency caused by long-term alcohol abuse is another common cause.  Traumatic. Any injury that cuts, crushes, or stretches a nerve can cause damage and pain. A common example is feeling pain after losing an arm or leg (phantom limb pain).  Compression-related. If a sensory nerve gets trapped or compressed for a long period of time, the blood supply to the nerve can be cut off.  Vascular. Many blood vessel diseases can cause neuropathic pain by decreasing blood supply and oxygen to nerves.  Autoimmune.  This type of pain results from diseases in which the body's defense system mistakenly attacks sensory nerves. Examples of autoimmune diseases that can cause neuropathic pain include lupus and multiple sclerosis.  Infectious. Many types of viral infections can damage sensory nerves and cause pain. Shingles infection is a common cause of this type of pain.  Inherited. Neuropathic pain can be a symptom of many diseases that are passed down through families (genetic).  What are the signs or symptoms? The main symptom is pain. Neuropathic pain is often described as:  Burning.  Shock-like.  Stinging.  Hot or cold.  Itching.  How is this diagnosed? No single test can diagnose neuropathic pain. Your health care provider will do a physical exam and ask you about your pain. You may use a pain scale to describe how bad your pain is. You may also have tests to see if you have a high sensitivity to pain and to help find the cause and location of any sensory nerve damage. These tests may include:  Imaging studies, such as: ? X-rays. ? CT scan. ? MRI.  Nerve conduction studies to test how well nerve signals travel through your sensory nerves (electrodiagnostic testing).  Stimulating your sensory nerves through electrodes on your skin and measuring the response in your spinal cord and brain (somatosensory evoked potentials).  How is this treated? Treatment for neuropathic pain may change over time. You may need to try different treatment options or a combination of treatments. Some options include:  Over-the-counter pain relievers.  Prescription medicines. Some medicines   used to treat other conditions may also help neuropathic pain. These include medicines to: ? Control seizures (anticonvulsants). ? Relieve depression (antidepressants).  Prescription-strength pain relievers (narcotics). These are usually used when other pain relievers do not help.  Transcutaneous nerve stimulation (TENS).  This uses electrical currents to block painful nerve signals. The treatment is painless.  Topical and local anesthetics. These are medicines that numb the nerves. They can be injected as a nerve block or applied to the skin.  Alternative treatments, such as: ? Acupuncture. ? Meditation. ? Massage. ? Physical therapy. ? Pain management programs. ? Counseling.  Follow these instructions at home:  Learn as much as you can about your condition.  Take medicines only as directed by your health care provider.  Work closely with all your health care providers to find what works best for you.  Have a good support system at home.  Consider joining a chronic pain support group. Contact a health care provider if:  Your pain treatments are not helping.  You are having side effects from your medicines.  You are struggling with fatigue, mood changes, depression, or anxiety. This information is not intended to replace advice given to you by your health care provider. Make sure you discuss any questions you have with your health care provider. Document Released: 12/21/2003 Document Revised: 10/13/2015 Document Reviewed: 09/02/2013 Elsevier Interactive Patient Education  2018 [employer]vier Inc.  

## 2018-02-03 NOTE — Progress Notes (Signed)
   Subjective:    Patient ID: Denise Stewart, female    DOB: 11/18/1940, 77 y.o.   MRN: [id number]  Chief Complaint  Patient presents with  . numbness in feet    HPI PT presents to the office today with numbness in bilateral feet and hands that started several months ago. She states this is unchanged. Denies any back pain, neck pain, or urinary incontinence. Denies any trauma.   She sates the numbness is a 10 out 10. She has tried Mobic with no relief.     Review of Systems  Cardiovascular: Negative for leg swelling.  All other systems reviewed and are negative.      Objective:   Physical Exam  Constitutional: She is oriented to person, place, and time. She appears well-developed and well-nourished. No distress.  HENT:  Head: Normocephalic.  Eyes: Pupils are equal, round, and reactive to light.  Neck: Normal range of motion. Neck supple. No thyromegaly present.  Cardiovascular: Normal rate, regular rhythm, normal heart sounds and intact distal pulses.  No murmur heard. Pulmonary/Chest: Effort normal and breath sounds normal. No respiratory distress. She has no wheezes.  Abdominal: Soft. Bowel sounds are normal. She exhibits no distension. There is no tenderness.  Musculoskeletal: Normal range of motion. She exhibits no edema or tenderness.  Full ROM of neck and lower back. No pain present. Good equal grips and strength bilaterally.   Neurological: She is alert and oriented to person, place, and time. She has normal reflexes. No cranial nerve deficit.  Skin: Skin is warm and dry.  Psychiatric: She has a normal mood and affect. Her behavior is normal. Judgment and thought content normal.  Vitals reviewed.     BP (!) 142/77   Pulse 72   Temp 97.6 F (36.4 C) (Oral)   Ht _0  (1.6 m)   Wt 156 lb 12.8 oz (71.1 kg)   BMI 27.78 kg/m      Assessment & Plan:  [name] comes in today with chief complaint of numbness in feet and hands   Diagnosis and orders  addressed:  1. Numbness in feet - DG Lumbar Spine 2-3 Views; Future - Vitamin B12 - BMP8+EGFR  2. Numbness and tingling in both hands - Vitamin B12 - BMP8+EGFR  3. Neuropathy - Vitamin B12 - BMP8+EGFR - gabapentin (NEURONTIN) 100 MG capsule; Take 1 capsule (100 mg total) by mouth 3 (three) times daily.  Dispense: 90 capsule; Refill: 3  4. Current smoker Smoking cessation discussed    Labs pending We will try gabapentin. She will start taking only at night and gradually increase to TID as tolerated Fall prevention and sedation precautions discussed If numbness does not improve may need neurologists referral?  Follow up plan: Keep follow up with PCP   Evelina Dun, FNP

## 2018-02-04 LAB — BMP8+EGFR
BUN/Creatinine Ratio: 14 (ref 12–28)
BUN: 19 mg/dL (ref 8–27)
CALCIUM: 9 mg/dL (ref 8.7–10.3)
CO2: 23 mmol/L (ref 20–29)
CREATININE: 1.33 mg/dL — AB (ref 0.57–1.00)
Chloride: 101 mmol/L (ref 96–106)
GFR calc Af Amer: 44 mL/min/{1.73_m2} — ABNORMAL LOW (ref >59)
GFR calc non Af Amer: 38 mL/min/{1.73_m2} — ABNORMAL LOW (ref >59)
GLUCOSE: 107 mg/dL — AB (ref 65–99)
Potassium: 4.4 mmol/L (ref 3.5–5.2)
Sodium: 140 mmol/L (ref 134–144)

## 2018-02-04 LAB — VITAMIN B12: Vitamin B-12: <150 pg/mL — ABNORMAL LOW (ref 232–1245)

## 2018-02-06 ENCOUNTER — Ambulatory Visit (INDEPENDENT_AMBULATORY_CARE_PROVIDER_SITE_OTHER): Payer: Medicare HMO | Admitting: *Deleted

## 2018-02-06 DIAGNOSIS — E538 Deficiency of other specified B group vitamins: Secondary | ICD-10-CM

## 2018-02-06 MED ORDER — CYANOCOBALAMIN 1000 MCG/ML IJ SOLN
1000.0000 ug | INTRAMUSCULAR | Status: AC
  Administered 2018-02-06 – 2019-01-29 (×12): 1000 ug via INTRAMUSCULAR

## 2018-02-06 NOTE — Progress Notes (Signed)
Pt given Cyanocobalamin inj Tolerated well 

## 2018-02-27 ENCOUNTER — Ambulatory Visit (INDEPENDENT_AMBULATORY_CARE_PROVIDER_SITE_OTHER): Payer: Medicare HMO | Admitting: Nurse Practitioner

## 2018-02-27 ENCOUNTER — Encounter: Payer: Self-pay | Admitting: Nurse Practitioner

## 2018-02-27 VITALS — BP 142/73 | HR 59 | Temp 97.0°F | Ht 63.0 in | Wt 158.0 lb

## 2018-02-27 DIAGNOSIS — F411 Generalized anxiety disorder: Secondary | ICD-10-CM | POA: Diagnosis not present

## 2018-02-27 DIAGNOSIS — I10 Essential (primary) hypertension: Secondary | ICD-10-CM

## 2018-02-27 DIAGNOSIS — Z6827 Body mass index (BMI) 27.0-27.9, adult: Secondary | ICD-10-CM

## 2018-02-27 DIAGNOSIS — F32A Depression, unspecified: Secondary | ICD-10-CM

## 2018-02-27 DIAGNOSIS — E785 Hyperlipidemia, unspecified: Secondary | ICD-10-CM | POA: Diagnosis not present

## 2018-02-27 DIAGNOSIS — G629 Polyneuropathy, unspecified: Secondary | ICD-10-CM

## 2018-02-27 DIAGNOSIS — F329 Major depressive disorder, single episode, unspecified: Secondary | ICD-10-CM

## 2018-02-27 DIAGNOSIS — E039 Hypothyroidism, unspecified: Secondary | ICD-10-CM | POA: Diagnosis not present

## 2018-02-27 DIAGNOSIS — F17200 Nicotine dependence, unspecified, uncomplicated: Secondary | ICD-10-CM

## 2018-02-27 MED ORDER — LISINOPRIL 40 MG PO TABS: ORAL_TABLET | ORAL | 1 refills | Status: DC

## 2018-02-27 MED ORDER — GABAPENTIN 100 MG PO CAPS: 100.0000 mg | ORAL_CAPSULE | Freq: Three times a day (TID) | ORAL | 3 refills | Status: DC

## 2018-02-27 MED ORDER — AMLODIPINE BESYLATE 5 MG PO TABS: ORAL_TABLET | ORAL | 1 refills | Status: DC

## 2018-02-27 MED ORDER — HYDROCHLOROTHIAZIDE 25 MG PO TABS: ORAL_TABLET | ORAL | 1 refills | Status: DC

## 2018-02-27 MED ORDER — LEVOTHYROXINE SODIUM 75 MCG PO TABS: ORAL_TABLET | ORAL | 1 refills | Status: DC

## 2018-02-27 MED ORDER — CLONAZEPAM 0.5 MG PO TABS: 0.5000 mg | ORAL_TABLET | Freq: Two times a day (BID) | ORAL | 2 refills | Status: DC

## 2018-02-27 MED ORDER — METOPROLOL SUCCINATE ER 100 MG PO TB24: 100.0000 mg | ORAL_TABLET | Freq: Every day | ORAL | 1 refills | Status: DC

## 2018-02-27 NOTE — Patient Instructions (Signed)
Steps to Quit Smoking Smoking tobacco can be bad for your health. It can also affect almost every organ in your body. Smoking puts you and people around you at risk for many serious long-lasting (chronic) diseases. Quitting smoking is hard, but it is one of the best things that you can do for your health. It is never too late to quit. What are the benefits of quitting smoking? When you quit smoking, you lower your risk for getting serious diseases and conditions. They can include:  Lung cancer or lung disease.  Heart disease.  Stroke.  Heart attack.  Not being able to have children (infertility).  Weak bones (osteoporosis) and broken bones (fractures).  If you have coughing, wheezing, and shortness of breath, those symptoms may get better when you quit. You may also get sick less often. If you are pregnant, quitting smoking can help to lower your chances of having a baby of low birth weight. What can I do to help me quit smoking? Talk with your doctor about what can help you quit smoking. Some things you can do (strategies) include:  Quitting smoking totally, instead of slowly cutting back how much you smoke over a period of time.  Going to in-person counseling. You are more likely to quit if you go to many counseling sessions.  Using resources and support systems, such as: ? Online chats with a counselor. ? Phone quitlines. ? Printed self-help materials. ? Support groups or group counseling. ? Text messaging programs. ? Mobile phone apps or applications.  Taking medicines. Some of these medicines may have nicotine in them. If you are pregnant or breastfeeding, do not take any medicines to quit smoking unless your doctor says it is okay. Talk with your doctor about counseling or other things that can help you.  Talk with your doctor about using more than one strategy at the same time, such as taking medicines while you are also going to in-person counseling. This can help make  quitting easier. What things can I do to make it easier to quit? Quitting smoking might feel very hard at first, but there is a lot that you can do to make it easier. Take these steps:  Talk to your family and friends. Ask them to support and encourage you.  Call phone quitlines, reach out to support groups, or work with a counselor.  Ask people who smoke to not smoke around you.  Avoid places that make you want (trigger) to smoke, such as: ? Bars. ? Parties. ? Smoke-break areas at work.  Spend time with people who do not smoke.  Lower the stress in your life. Stress can make you want to smoke. Try these things to help your stress: ? Getting regular exercise. ? Deep-breathing exercises. ? Yoga. ? Meditating. ? Doing a body scan. To do this, close your eyes, focus on one area of your body at a time from head to toe, and notice which parts of your body are tense. Try to relax the muscles in those areas.  Download or buy apps on your mobile phone or tablet that can help you stick to your quit plan. There are many free apps, such as QuitGuide from the CDC (Centers for Disease Control and Prevention). You can find more support from smokefree.gov and other websites.  This information is not intended to replace advice given to you by your health care provider. Make sure you discuss any questions you have with your health care provider. Document Released: 01/19/2009 Document   Revised: 11/21/2015 Document Reviewed: 08/09/2014 Elsevier Interactive Patient Education  2018 Elsevier Inc.  

## 2018-02-27 NOTE — Progress Notes (Signed)
Subjective:    Patient ID: Denise Stewart, female    DOB: 1940-06-21, 77 y.o.   MRN: [id number]   Chief Complaint: medical management of chronic issues  HPI:  1. Essential hypertension  No c/o chest pain, sob or headache. Does not check blood pressure at home. BP Readings from Last 3 Encounters:  02/03/18 136/77  09/04/17 132/79  05/13/17 128/71     2. Hypothyroidism, unspecified type  No problems that she is aware of  3. BMI 27.0-27.9,adult  No recent weight changes  4. Depression, unspecified depression type  Patient is on celexa and is doing well currently Depression screen [facility] 2/9 02/27/2018 02/03/2018 09/04/2017  Decreased Interest 1 0 1  Down, Depressed, Hopeless 1 1 1   PHQ - 2 Score 2 1 2   Altered sleeping 0 - 0  Tired, decreased energy 2 - 1  Change in appetite 0 - 0  Feeling bad or failure about yourself  0 - 0  Trouble concentrating 0 - 0  Moving slowly or fidgety/restless 0 - 0  Suicidal thoughts 0 - 0  PHQ-9 Score 4 - 3  Difficult doing work/chores - - -     5. GAD (generalized anxiety disorder)  takes klonopin BID  6. Hyperlipidemia, unspecified hyperlipidemia type  Does not watch diet and does very little exercise  7. Current smoker  Has no desire to quit smoking  8.      b12 anemia           has recently started on b12 injections. She had come in c/o tingling              in hands and her b12 was 150. Still has slight numbness and tingling           bil hands.    Outpatient Encounter Medications as of 02/27/2018  Medication Sig  . amLODipine (NORVASC) 5 MG tablet TAKE ONE (1) TABLET EACH DAY  . aspirin 81 MG tablet Take 81 mg by mouth daily.  . calcium carbonate (OS-CAL) 600 MG TABS Take 600 mg by mouth 2 (two) times daily with a meal.  . Cholecalciferol (VITAMIN D-3) 1000 UNITS CAPS Take 1,000 Units by mouth daily.  . citalopram (CELEXA) 20 MG tablet TAKE ONE (1) TABLET EACH DAY  . clonazePAM (KLONOPIN) 0.5 MG tablet Take 1 tablet (0.5 mg total)  by mouth 2 (two) times daily.  . fish oil-omega-3 fatty acids 1000 MG capsule Take 2 g by mouth daily.  Marland Kitchen gabapentin (NEURONTIN) 100 MG capsule Take 1 capsule (100 mg total) by mouth 3 (three) times daily.  . hydrochlorothiazide (HYDRODIURIL) 25 MG tablet TAKE ONE (1) TABLET EACH DAY  . levothyroxine (SYNTHROID, LEVOTHROID) 75 MCG tablet TAKE ONE TABLET EACH MORNING BEFORE BREAKFAST  . lisinopril (PRINIVIL,ZESTRIL) 40 MG tablet TAKE ONE (1) TABLET EACH DAY  . metoprolol succinate (TOPROL-XL) 100 MG 24 hr tablet Take 1 tablet (100 mg total) by mouth daily. with food      New complaints: None today  Social history: She still lives independently. Has frequent friends that visit her. She still has not gotten a medical alert system as encouraged at last visit.   Review of Systems  Constitutional: Negative for activity change and appetite change.  HENT: Negative.   Eyes: Negative for pain.  Respiratory: Negative for shortness of breath.   Cardiovascular: Negative for chest pain, palpitations and leg swelling.  Gastrointestinal: Negative for abdominal pain.  Endocrine: Negative for polydipsia.  Genitourinary: Negative.  Skin: Negative for rash.  Neurological: Negative for dizziness, weakness and headaches.  Hematological: Does not bruise/bleed easily.  Psychiatric/Behavioral: Negative.   All other systems reviewed and are negative.      Objective:   Physical Exam  Constitutional: She is oriented to person, place, and time. She appears well-developed and well-nourished. No distress.  HENT:  Head: Normocephalic.  Nose: Nose normal.  Mouth/Throat: Oropharynx is clear and moist.  Eyes: Pupils are equal, round, and reactive to light. EOM are normal.  Neck: Normal range of motion. Neck supple. No JVD present. Carotid bruit is not present.  Cardiovascular: Normal rate, regular rhythm, normal heart sounds and intact distal pulses.  Pulmonary/Chest: Effort normal and breath sounds  normal. No respiratory distress. She has no wheezes. She has no rales. She exhibits no tenderness.  Abdominal: Soft. Normal appearance, normal aorta and bowel sounds are normal. She exhibits no distension, no abdominal bruit, no pulsatile midline mass and no mass. There is no splenomegaly or hepatomegaly. There is no tenderness.  Musculoskeletal: Normal range of motion. She exhibits no edema.  Lymphadenopathy:    She has no cervical adenopathy.  Neurological: She is alert and oriented to person, place, and time. She has normal reflexes.  Skin: Skin is warm and dry.  Psychiatric: She has a normal mood and affect. Her behavior is normal. Judgment and thought content normal.  Nursing note and vitals reviewed.  BP (!) 142/73   Pulse (!) 59   Temp (!) 97 F (36.1 C) (Oral)   Ht 5' 3"  (1.6 m)   Wt 158 lb (71.7 kg)   BMI 27.99 kg/m        Assessment & Plan:  Denise Stewart comes in today with chief complaint of Medical Management of Chronic Issues   Diagnosis and orders addressed:  1. Essential hypertension Low sodium diet - metoprolol succinate (TOPROL-XL) 100 MG 24 hr tablet; Take 1 tablet (100 mg total) by mouth daily. with food  Dispense: 90 tablet; Refill: 1 - lisinopril (PRINIVIL,ZESTRIL) 40 MG tablet; TAKE ONE (1) TABLET EACH DAY  Dispense: 90 tablet; Refill: 1 - hydrochlorothiazide (HYDRODIURIL) 25 MG tablet; TAKE ONE (1) TABLET EACH DAY  Dispense: 90 tablet; Refill: 1 - amLODipine (NORVASC) 5 MG tablet; TAKE ONE (1) TABLET EACH DAY  Dispense: 90 tablet; Refill: 1 - CMP14+EGFR  2. Hypothyroidism, unspecified type - levothyroxine (SYNTHROID, LEVOTHROID) 75 MCG tablet; TAKE ONE TABLET EACH MORNING BEFORE BREAKFAST  Dispense: 90 tablet; Refill: 1 - Thyroid Panel With TSH  3. BMI 27.0-27.9,adult Discussed diet and exercise for person with BMI >25 Will recheck weight in 3-6 months  4. Depression, unspecified depression type stress management  5. GAD (generalized anxiety  disorder) - clonazePAM (KLONOPIN) 0.5 MG tablet; Take 1 tablet (0.5 mg total) by mouth 2 (two) times daily.  Dispense: 60 tablet; Refill: 2  6. Hyperlipidemia, unspecified hyperlipidemia type Low fat diet - Lipid panel  7. Current smoker Says she will quit when she is ready  8. Neuropathy Continue gabapentin as needed - gabapentin (NEURONTIN) 100 MG capsule; Take 1 capsule (100 mg total) by mouth 3 (three) times daily.  Dispense: 90 capsule; Refill: 3   Labs pending Health Maintenance reviewed Diet and exercise encouraged  Follow up plan: 6 months   Mary-Margaret Hassell Done, FNP

## 2018-02-28 LAB — LIPID PANEL
Chol/HDL Ratio: 3.8 ratio (ref 0.0–4.4)
Cholesterol, Total: 142 mg/dL (ref 100–199)
HDL: 37 mg/dL — ABNORMAL LOW (ref >39)
LDL Calculated: 71 mg/dL (ref 0–99)
Triglycerides: 172 mg/dL — ABNORMAL HIGH (ref 0–149)
VLDL Cholesterol Cal: 34 mg/dL (ref 5–40)

## 2018-02-28 LAB — THYROID PANEL WITH TSH
Free Thyroxine Index: 2.4 (ref 1.2–4.9)
T3 UPTAKE RATIO: 27 % (ref 24–39)
T4, Total: 8.9 ug/dL (ref 4.5–12.0)
TSH: 5.47 u[IU]/mL — AB (ref 0.450–4.500)

## 2018-02-28 LAB — CMP14+EGFR
A/G RATIO: 1.6 (ref 1.2–2.2)
ALBUMIN: 4.1 g/dL (ref 3.5–4.8)
ALT: 13 IU/L (ref 0–32)
AST: 17 IU/L (ref 0–40)
Alkaline Phosphatase: 88 IU/L (ref 39–117)
BILIRUBIN TOTAL: 0.4 mg/dL (ref 0.0–1.2)
BUN / CREAT RATIO: 12 (ref 12–28)
BUN: 16 mg/dL (ref 8–27)
CHLORIDE: 100 mmol/L (ref 96–106)
CO2: 25 mmol/L (ref 20–29)
Calcium: 9.2 mg/dL (ref 8.7–10.3)
Creatinine, Ser: 1.35 mg/dL — ABNORMAL HIGH (ref 0.57–1.00)
GFR calc non Af Amer: 37 mL/min/{1.73_m2} — ABNORMAL LOW (ref >59)
GFR, EST AFRICAN AMERICAN: 43 mL/min/{1.73_m2} — AB (ref >59)
Globulin, Total: 2.5 g/dL (ref 1.5–4.5)
Glucose: 92 mg/dL (ref 65–99)
POTASSIUM: 4.1 mmol/L (ref 3.5–5.2)
Sodium: 139 mmol/L (ref 134–144)
Total Protein: 6.6 g/dL (ref 6.0–8.5)

## 2018-03-02 MED ORDER — LEVOTHYROXINE SODIUM 88 MCG PO TABS: 88.0000 ug | ORAL_TABLET | Freq: Every day | ORAL | 3 refills | Status: DC

## 2018-03-02 NOTE — Addendum Note (Signed)
Addended by: Bennie PieriniMARTIN, MARY-MARGARET on: 03/02/2018 03:52 PM   Modules accepted: Orders

## 2018-03-11 ENCOUNTER — Ambulatory Visit (INDEPENDENT_AMBULATORY_CARE_PROVIDER_SITE_OTHER): Payer: Medicare HMO | Admitting: *Deleted

## 2018-03-11 DIAGNOSIS — E538 Deficiency of other specified B group vitamins: Secondary | ICD-10-CM

## 2018-03-11 NOTE — Progress Notes (Signed)
Tolerated B12 injection well

## 2018-03-25 ENCOUNTER — Other Ambulatory Visit: Payer: Self-pay | Admitting: Nurse Practitioner

## 2018-03-25 DIAGNOSIS — F3342 Major depressive disorder, recurrent, in full remission: Secondary | ICD-10-CM

## 2018-04-13 ENCOUNTER — Ambulatory Visit (INDEPENDENT_AMBULATORY_CARE_PROVIDER_SITE_OTHER): Payer: Medicare HMO | Admitting: *Deleted

## 2018-04-13 DIAGNOSIS — E538 Deficiency of other specified B group vitamins: Secondary | ICD-10-CM | POA: Diagnosis not present

## 2018-04-13 NOTE — Progress Notes (Signed)
Pt given cyanocobalamin inj Tolerated well 

## 2018-05-14 ENCOUNTER — Ambulatory Visit: Payer: Medicare HMO

## 2018-05-15 ENCOUNTER — Ambulatory Visit (INDEPENDENT_AMBULATORY_CARE_PROVIDER_SITE_OTHER): Payer: Medicare HMO | Admitting: *Deleted

## 2018-05-15 DIAGNOSIS — E538 Deficiency of other specified B group vitamins: Secondary | ICD-10-CM | POA: Diagnosis not present

## 2018-05-15 NOTE — Progress Notes (Signed)
B12 injection given and tolerated well.  

## 2018-06-15 ENCOUNTER — Ambulatory Visit (INDEPENDENT_AMBULATORY_CARE_PROVIDER_SITE_OTHER): Payer: Medicare HMO | Admitting: *Deleted

## 2018-06-15 DIAGNOSIS — E538 Deficiency of other specified B group vitamins: Secondary | ICD-10-CM

## 2018-06-15 NOTE — Progress Notes (Signed)
Pt given cyanocobalamin inj Tolerated well 

## 2018-06-19 ENCOUNTER — Other Ambulatory Visit: Payer: Self-pay | Admitting: Nurse Practitioner

## 2018-06-19 DIAGNOSIS — F3342 Major depressive disorder, recurrent, in full remission: Secondary | ICD-10-CM

## 2018-06-19 NOTE — Telephone Encounter (Signed)
Last seen 02/27/18

## 2018-07-16 ENCOUNTER — Other Ambulatory Visit: Payer: Self-pay

## 2018-07-16 ENCOUNTER — Ambulatory Visit (INDEPENDENT_AMBULATORY_CARE_PROVIDER_SITE_OTHER): Payer: Medicare HMO | Admitting: *Deleted

## 2018-07-16 DIAGNOSIS — E538 Deficiency of other specified B group vitamins: Secondary | ICD-10-CM | POA: Diagnosis not present

## 2018-07-16 NOTE — Progress Notes (Signed)
Pt given Cyanocobalamin inj Tolerated well 

## 2018-08-17 ENCOUNTER — Other Ambulatory Visit: Payer: Self-pay

## 2018-08-18 ENCOUNTER — Ambulatory Visit (INDEPENDENT_AMBULATORY_CARE_PROVIDER_SITE_OTHER): Payer: Medicare HMO | Admitting: *Deleted

## 2018-08-18 DIAGNOSIS — E538 Deficiency of other specified B group vitamins: Secondary | ICD-10-CM

## 2018-08-18 NOTE — Progress Notes (Signed)
Pt given cyanocobalamin inj Tolerated well 

## 2018-09-18 ENCOUNTER — Other Ambulatory Visit: Payer: Self-pay | Admitting: Nurse Practitioner

## 2018-09-18 ENCOUNTER — Other Ambulatory Visit: Payer: Self-pay

## 2018-09-18 ENCOUNTER — Ambulatory Visit (INDEPENDENT_AMBULATORY_CARE_PROVIDER_SITE_OTHER): Payer: Medicare HMO | Admitting: *Deleted

## 2018-09-18 DIAGNOSIS — F411 Generalized anxiety disorder: Secondary | ICD-10-CM

## 2018-09-18 DIAGNOSIS — I10 Essential (primary) hypertension: Secondary | ICD-10-CM

## 2018-09-18 DIAGNOSIS — E538 Deficiency of other specified B group vitamins: Secondary | ICD-10-CM | POA: Diagnosis not present

## 2018-09-18 DIAGNOSIS — F3342 Major depressive disorder, recurrent, in full remission: Secondary | ICD-10-CM

## 2018-09-18 NOTE — Progress Notes (Signed)
Pt given Cyanocobalamin inj Tolerated well 

## 2018-10-14 ENCOUNTER — Other Ambulatory Visit: Payer: Self-pay

## 2018-10-15 ENCOUNTER — Telehealth: Payer: Self-pay | Admitting: Nurse Practitioner

## 2018-10-15 NOTE — Telephone Encounter (Signed)
Daughter aware to hold off otc B12 until next appt

## 2018-10-15 NOTE — Telephone Encounter (Signed)
Daughter aware that patient does not need to be taking B12 injections monthly and otc b12

## 2018-10-19 ENCOUNTER — Ambulatory Visit: Payer: Medicare HMO

## 2018-10-20 ENCOUNTER — Other Ambulatory Visit: Payer: Self-pay

## 2018-10-20 ENCOUNTER — Telehealth: Payer: Self-pay | Admitting: Nurse Practitioner

## 2018-10-21 ENCOUNTER — Ambulatory Visit (INDEPENDENT_AMBULATORY_CARE_PROVIDER_SITE_OTHER): Payer: Medicare HMO | Admitting: Nurse Practitioner

## 2018-10-21 ENCOUNTER — Encounter: Payer: Self-pay | Admitting: Nurse Practitioner

## 2018-10-21 VITALS — BP 133/76 | HR 70 | Temp 97.9°F | Ht 63.0 in | Wt 145.0 lb

## 2018-10-21 DIAGNOSIS — G629 Polyneuropathy, unspecified: Secondary | ICD-10-CM | POA: Diagnosis not present

## 2018-10-21 DIAGNOSIS — E785 Hyperlipidemia, unspecified: Secondary | ICD-10-CM | POA: Diagnosis not present

## 2018-10-21 DIAGNOSIS — F17200 Nicotine dependence, unspecified, uncomplicated: Secondary | ICD-10-CM

## 2018-10-21 DIAGNOSIS — I10 Essential (primary) hypertension: Secondary | ICD-10-CM

## 2018-10-21 DIAGNOSIS — F3342 Major depressive disorder, recurrent, in full remission: Secondary | ICD-10-CM | POA: Insufficient documentation

## 2018-10-21 DIAGNOSIS — F411 Generalized anxiety disorder: Secondary | ICD-10-CM | POA: Diagnosis not present

## 2018-10-21 DIAGNOSIS — E039 Hypothyroidism, unspecified: Secondary | ICD-10-CM | POA: Diagnosis not present

## 2018-10-21 DIAGNOSIS — Z6827 Body mass index (BMI) 27.0-27.9, adult: Secondary | ICD-10-CM

## 2018-10-21 DIAGNOSIS — E538 Deficiency of other specified B group vitamins: Secondary | ICD-10-CM

## 2018-10-21 MED ORDER — LISINOPRIL 40 MG PO TABS: 40.0000 mg | ORAL_TABLET | Freq: Every day | ORAL | 0 refills | Status: DC

## 2018-10-21 MED ORDER — METOPROLOL SUCCINATE ER 100 MG PO TB24: 100.0000 mg | ORAL_TABLET | Freq: Every day | ORAL | 0 refills | Status: DC

## 2018-10-21 MED ORDER — AMLODIPINE BESYLATE 5 MG PO TABS: 5.0000 mg | ORAL_TABLET | Freq: Every day | ORAL | 0 refills | Status: DC

## 2018-10-21 MED ORDER — CITALOPRAM HYDROBROMIDE 20 MG PO TABS: 20.0000 mg | ORAL_TABLET | Freq: Every day | ORAL | 0 refills | Status: DC

## 2018-10-21 MED ORDER — LEVOTHYROXINE SODIUM 88 MCG PO TABS: 88.0000 ug | ORAL_TABLET | Freq: Every day | ORAL | 3 refills | Status: DC

## 2018-10-21 MED ORDER — CLONAZEPAM 0.5 MG PO TABS: 0.5000 mg | ORAL_TABLET | Freq: Two times a day (BID) | ORAL | 2 refills | Status: DC

## 2018-10-21 MED ORDER — HYDROCHLOROTHIAZIDE 25 MG PO TABS: 25.0000 mg | ORAL_TABLET | Freq: Every day | ORAL | 0 refills | Status: DC

## 2018-10-21 MED ORDER — GABAPENTIN 100 MG PO CAPS: 100.0000 mg | ORAL_CAPSULE | Freq: Three times a day (TID) | ORAL | 3 refills | Status: DC

## 2018-10-21 NOTE — Progress Notes (Signed)
Subjective:    Patient ID: Denise Stewart, female    DOB: Aug 25, 1940, 78 y.o.   MRN: [id number]   Chief Complaint: Medical Management of Chronic Issues    HPI:  1. Essential hypertension No c/o chest pain, sob or headache. Does not check blood pressure at home. BP Readings from Last 3 Encounters:  10/21/18 133/76  02/27/18 (!) 142/73  02/03/18 136/77    2. Hyperlipidemia, unspecified hyperlipidemia type Does try to watch diet. Does very little exercise if any.  3. Hypothyroidism, unspecified type No problems that aware of.  4. Depression, unspecified depression type She is on celexa and klonopin. The celexa works well but the klonopin helps with her worrying.  Depression screen [facility] 2/9 10/21/2018 02/27/2018 02/03/2018  Decreased Interest 0 1 0  Down, Depressed, Hopeless 0 1 1  PHQ - 2 Score 0 2 1  Altered sleeping - 0 -  Tired, decreased energy - 2 -  Change in appetite - 0 -  Feeling bad or failure about yourself  - 0 -  Trouble concentrating - 0 -  Moving slowly or fidgety/restless - 0 -  Suicidal thoughts - 0 -  PHQ-9 Score - 4 -  Difficult doing work/chores - - -    5. GAD (generalized anxiety disorder) Take klonopin daily. Says she will worry to death if she doe snot take. GAD 7 : Generalized Anxiety Score 10/21/2018 09/04/2017  Nervous, Anxious, on Edge 1 0  Control/stop worrying 1 2  Worry too much - different things 1 2  Trouble relaxing 1 0  Restless 0 0  Easily annoyed or irritable 0 0  Afraid - awful might happen 0 0  Total GAD 7 Score 4 4  Anxiety Difficulty Somewhat difficult -       6. Current smoker Smokes over a pack a day  7. BMI 27.0-27.9,adult No recent weight changes    Outpatient Encounter Medications as of 10/21/2018  Medication Sig  . amLODipine (NORVASC) 5 MG tablet Take 1 tablet (5 mg total) by mouth daily. (Needs to be seen before next refill)  . aspirin 81 MG tablet Take 81 mg by mouth daily.  . calcium carbonate (OS-CAL)  600 MG TABS Take 600 mg by mouth 2 (two) times daily with a meal.  . Cholecalciferol (VITAMIN D-3) 1000 UNITS CAPS Take 1,000 Units by mouth daily.  . citalopram (CELEXA) 20 MG tablet Take 1 tablet (20 mg total) by mouth daily. (Needs to be seen before next refill)  . clonazePAM (KLONOPIN) 0.5 MG tablet Take 1 tablet (0.5 mg total) by mouth 2 (two) times daily.  . fish oil-omega-3 fatty acids 1000 MG capsule Take 2 g by mouth daily.  Marland Kitchen gabapentin (NEURONTIN) 100 MG capsule Take 1 capsule (100 mg total) by mouth 3 (three) times daily.  . hydrochlorothiazide (HYDRODIURIL) 25 MG tablet Take 1 tablet (25 mg total) by mouth daily. (Needs to be seen before next refill)  . levothyroxine (SYNTHROID, LEVOTHROID) 88 MCG tablet Take 1 tablet (88 mcg total) by mouth daily.  Marland Kitchen lisinopril (ZESTRIL) 40 MG tablet Take 1 tablet (40 mg total) by mouth daily. (Needs to be seen before next refill)  . metoprolol succinate (TOPROL-XL) 100 MG 24 hr tablet Take 1 tablet (100 mg total) by mouth daily. (Needs to be seen before next refill)with food     Past Surgical History:  Procedure Laterality Date  . EYE SURGERY Right 1970   right -UNDER eye lesion removed  Family History  Problem Relation Age of Onset  . Hypertension Mother   . Heart attack Mother 73  . Heart disease Mother   . Clotting disorder Father 100  . Stroke Brother   . Suicidality Sister   . Other Sister        train accident  . Cancer Sister        liver cancer  . Diabetes Brother   . Other Brother        storm accident  . Diabetes Brother        leg removed   . Cancer Brother        lung  . Cancer Brother        lymphoma    New complaints: Saw C.Hawks,FNP in October and was dx with neuropathy in her feet. She says her feet are either numb or hurting all the time. Has made her fall a couple of times because she cant feel her feet. She was given neurontin to take 3x a day but did  not take it long.  Social history: Lives by  herself. Daughter checks on her frequently.  Controlled substance contract: 10/21/18    Review of Systems  Constitutional: Negative for activity change and appetite change.  HENT: Negative.   Eyes: Negative for pain.  Respiratory: Negative for shortness of breath.   Cardiovascular: Negative for chest pain, palpitations and leg swelling.  Gastrointestinal: Negative for abdominal pain.  Endocrine: Negative for polydipsia.  Genitourinary: Negative.   Skin: Negative for rash.  Neurological: Positive for numbness (bil feet). Negative for dizziness, weakness and headaches.  Hematological: Does not bruise/bleed easily.  Psychiatric/Behavioral: Negative.   All other systems reviewed and are negative.      Objective:   Physical Exam Vitals signs and nursing note reviewed.  Constitutional:      General: She is not in acute distress.    Appearance: Normal appearance. She is well-developed.  HENT:     Head: Normocephalic.     Nose: Nose normal.  Eyes:     Pupils: Pupils are equal, round, and reactive to light.  Neck:     Musculoskeletal: Normal range of motion and neck supple.     Vascular: No carotid bruit or JVD.  Cardiovascular:     Rate and Rhythm: Normal rate and regular rhythm.     Heart sounds: Normal heart sounds.  Pulmonary:     Effort: Pulmonary effort is normal. No respiratory distress.     Breath sounds: Normal breath sounds. No wheezing or rales.  Chest:     Chest wall: No tenderness.  Abdominal:     General: Bowel sounds are normal. There is no distension or abdominal bruit.     Palpations: Abdomen is soft. There is no hepatomegaly, splenomegaly, mass or pulsatile mass.     Tenderness: There is no abdominal tenderness.  Musculoskeletal: Normal range of motion.     Comments: positive monofilament bil feet  Lymphadenopathy:     Cervical: No cervical adenopathy.  Skin:    General: Skin is warm and dry.  Neurological:     Mental Status: She is alert and oriented  to person, place, and time.     Deep Tendon Reflexes: Reflexes are normal and symmetric.  Psychiatric:        Behavior: Behavior normal.        Thought Content: Thought content normal.        Judgment: Judgment normal.    BP 133/76   Pulse 70  Temp 97.9 F (36.6 C) (Oral)   Ht _0  (1.6 m)   Wt 145 lb (65.8 kg)   BMI 25.69 kg/m         Assessment & Plan:  Viktorya Arguijo comes in today with chief complaint of Medical Management of Chronic Issues   Diagnosis and orders addressed:  1. Essential hypertension Low sodium diet - amLODipine (NORVASC) 5 MG tablet; Take 1 tablet (5 mg total) by mouth daily. (Needs to be seen before next refill)  Dispense: 30 tablet; Refill: 0 - lisinopril (ZESTRIL) 40 MG tablet; Take 1 tablet (40 mg total) by mouth daily. (Needs to be seen before next refill)  Dispense: 30 tablet; Refill: 0 - hydrochlorothiazide (HYDRODIURIL) 25 MG tablet; Take 1 tablet (25 mg total) by mouth daily. (Needs to be seen before next refill)  Dispense: 30 tablet; Refill: 0 - metoprolol succinate (TOPROL-XL) 100 MG 24 hr tablet; Take 1 tablet (100 mg total) by mouth daily. (Needs to be seen before next refill)with food  Dispense: 30 tablet; Refill: 0 - CMP14+EGFR  2. Hyperlipidemia, unspecified hyperlipidemia type Low fat diet - Lipid panel  3. Hypothyroidism, unspecified type Pending labs - levothyroxine (SYNTHROID) 88 MCG tablet; Take 1 tablet (88 mcg total) by mouth daily.  Dispense: 90 tablet; Refill: 3 - Thyroid Panel With TSH  4. GAD (generalized anxiety disorder) Stress management - clonazePAM (KLONOPIN) 0.5 MG tablet; Take 1 tablet (0.5 mg total) by mouth 2 (two) times daily.  Dispense: 60 tablet; Refill: 2  5. Current smoker Smoking cessation encouraged  6. Neuropathy Do not go barefooted - gabapentin (NEURONTIN) 100 MG capsule; Take 1 capsule (100 mg total) by mouth 3 (three) times daily.  Dispense: 90 capsule; Refill: 3  7. Recurrent major  depressive disorder, in full remission ([facility]) - citalopram (CELEXA) 20 MG tablet; Take 1 tablet (20 mg total) by mouth daily. (Needs to be seen before next refill)  Dispense: 30 tablet; Refill: 0  8. BMI 27.0-27.9,adult Discussed diet and exercise for person with BMI >25 Will recheck weight in 3-6 months   Labs pending Health Maintenance reviewed Diet and exercise encouraged  Follow up plan: 6 months   Mary-Margaret Hassell Done, FNP

## 2018-10-21 NOTE — Patient Instructions (Signed)

## 2018-10-22 LAB — CMP14+EGFR
ALT: 11 IU/L (ref 0–32)
AST: 13 IU/L (ref 0–40)
Albumin/Globulin Ratio: 1.6 (ref 1.2–2.2)
Albumin: 4.2 g/dL (ref 3.7–4.7)
Alkaline Phosphatase: 94 IU/L (ref 39–117)
BUN/Creatinine Ratio: 19 (ref 12–28)
BUN: 21 mg/dL (ref 8–27)
Bilirubin Total: 0.5 mg/dL (ref 0.0–1.2)
CO2: 24 mmol/L (ref 20–29)
Calcium: 9.1 mg/dL (ref 8.7–10.3)
Chloride: 102 mmol/L (ref 96–106)
Creatinine, Ser: 1.1 mg/dL — ABNORMAL HIGH (ref 0.57–1.00)
GFR calc Af Amer: 55 mL/min/{1.73_m2} — ABNORMAL LOW (ref >59)
GFR calc non Af Amer: 48 mL/min/{1.73_m2} — ABNORMAL LOW (ref >59)
Globulin, Total: 2.6 g/dL (ref 1.5–4.5)
Glucose: 92 mg/dL (ref 65–99)
Potassium: 4.1 mmol/L (ref 3.5–5.2)
Sodium: 139 mmol/L (ref 134–144)
Total Protein: 6.8 g/dL (ref 6.0–8.5)

## 2018-10-22 LAB — LIPID PANEL
Chol/HDL Ratio: 3.7 ratio (ref 0.0–4.4)
Cholesterol, Total: 145 mg/dL (ref 100–199)
HDL: 39 mg/dL — ABNORMAL LOW (ref >39)
LDL Calculated: 79 mg/dL (ref 0–99)
Triglycerides: 135 mg/dL (ref 0–149)
VLDL Cholesterol Cal: 27 mg/dL (ref 5–40)

## 2018-10-22 LAB — THYROID PANEL WITH TSH
Free Thyroxine Index: 2.9 (ref 1.2–4.9)
T3 Uptake Ratio: 28 % (ref 24–39)
T4, Total: 10.5 ug/dL (ref 4.5–12.0)
TSH: 0.563 u[IU]/mL (ref 0.450–4.500)

## 2018-10-30 ENCOUNTER — Telehealth: Payer: Self-pay | Admitting: Nurse Practitioner

## 2018-10-30 NOTE — Chronic Care Management (AMB) (Signed)
Chronic Care Management   Note  10/30/2018 Name: Denise Stewart MRN: [id number] DOB: [birth year]  [name] is a 78 y.o. year old female who is a primary care patient of Chevis Pretty, [place]. I reached out to Annye English by phone today in response to a referral sent by [name]'s health plan.    [name] was given information about Chronic Care Management services today including:  1. CCM service includes personalized support from designated clinical staff supervised by her physician, including individualized plan of care and coordination with other care providers 2. 24/7 contact phone numbers for assistance for urgent and routine care needs. 3. Service will only be billed when office clinical staff spend 20 minutes or more in a month to coordinate care. 4. Only one practitioner may furnish and bill the service in a calendar month. 5. The patient may stop CCM services at any time (effective at the end of the month) by phone call to the office staff. 6. The patient will be responsible for cost sharing (co-pay) of up to 20% of the service fee (after annual deductible is met).  Patient did not agree to enrollment in care management services and does not wish to consider at this time.  Follow up plan: The patient has been provided with contact information for the chronic care management team and has been advised to call with any health related questions or concerns.  Hammond  ??[email or web address]   ??[phone]

## 2018-11-04 ENCOUNTER — Telehealth: Payer: Self-pay | Admitting: Nurse Practitioner

## 2018-11-04 NOTE — Telephone Encounter (Signed)
Aware of lab results and due for lab work around end of year.

## 2018-11-24 ENCOUNTER — Ambulatory Visit: Payer: Medicare HMO

## 2018-11-30 ENCOUNTER — Other Ambulatory Visit: Payer: Self-pay

## 2018-12-01 ENCOUNTER — Ambulatory Visit (INDEPENDENT_AMBULATORY_CARE_PROVIDER_SITE_OTHER): Payer: Medicare HMO | Admitting: *Deleted

## 2018-12-01 DIAGNOSIS — E538 Deficiency of other specified B group vitamins: Secondary | ICD-10-CM | POA: Diagnosis not present

## 2018-12-01 NOTE — Progress Notes (Signed)
Pt given Cyanocobalamin inj Tolerated well 

## 2018-12-21 ENCOUNTER — Other Ambulatory Visit: Payer: Self-pay | Admitting: Nurse Practitioner

## 2018-12-21 DIAGNOSIS — I10 Essential (primary) hypertension: Secondary | ICD-10-CM

## 2018-12-21 DIAGNOSIS — F3342 Major depressive disorder, recurrent, in full remission: Secondary | ICD-10-CM

## 2018-12-25 ENCOUNTER — Telehealth: Payer: Self-pay | Admitting: Nurse Practitioner

## 2018-12-25 DIAGNOSIS — I10 Essential (primary) hypertension: Secondary | ICD-10-CM

## 2018-12-25 DIAGNOSIS — F3342 Major depressive disorder, recurrent, in full remission: Secondary | ICD-10-CM

## 2018-12-25 MED ORDER — HYDROCHLOROTHIAZIDE 25 MG PO TABS: ORAL_TABLET | ORAL | 3 refills | Status: DC

## 2018-12-25 MED ORDER — LISINOPRIL 40 MG PO TABS: ORAL_TABLET | ORAL | 3 refills | Status: DC

## 2018-12-25 MED ORDER — METOPROLOL SUCCINATE ER 100 MG PO TB24: 100.0000 mg | ORAL_TABLET | Freq: Every day | ORAL | 3 refills | Status: DC

## 2018-12-25 MED ORDER — CITALOPRAM HYDROBROMIDE 20 MG PO TABS: ORAL_TABLET | ORAL | 3 refills | Status: DC

## 2018-12-25 NOTE — Telephone Encounter (Signed)
90 day sent to pharm

## 2018-12-31 ENCOUNTER — Other Ambulatory Visit: Payer: Self-pay

## 2019-01-01 ENCOUNTER — Ambulatory Visit (INDEPENDENT_AMBULATORY_CARE_PROVIDER_SITE_OTHER): Payer: Medicare HMO

## 2019-01-01 DIAGNOSIS — E538 Deficiency of other specified B group vitamins: Secondary | ICD-10-CM

## 2019-01-01 NOTE — Progress Notes (Signed)
Cyanocobalamin injection given.  Patient tolerated well. 

## 2019-01-29 ENCOUNTER — Other Ambulatory Visit: Payer: Self-pay

## 2019-01-29 ENCOUNTER — Ambulatory Visit (INDEPENDENT_AMBULATORY_CARE_PROVIDER_SITE_OTHER): Payer: Medicare HMO

## 2019-01-29 DIAGNOSIS — E538 Deficiency of other specified B group vitamins: Secondary | ICD-10-CM | POA: Diagnosis not present

## 2019-01-29 NOTE — Progress Notes (Signed)
Cyanocobalamin injection given.  Patient tolerated well. 

## 2019-02-25 ENCOUNTER — Other Ambulatory Visit: Payer: Self-pay

## 2019-02-26 ENCOUNTER — Ambulatory Visit (INDEPENDENT_AMBULATORY_CARE_PROVIDER_SITE_OTHER): Payer: Medicare HMO

## 2019-02-26 DIAGNOSIS — E538 Deficiency of other specified B group vitamins: Secondary | ICD-10-CM | POA: Diagnosis not present

## 2019-02-26 MED ORDER — CYANOCOBALAMIN 1000 MCG/ML IJ SOLN
1000.0000 ug | INTRAMUSCULAR | Status: AC
  Administered 2019-02-26 – 2020-07-24 (×17): 1000 ug via INTRAMUSCULAR

## 2019-02-26 NOTE — Progress Notes (Signed)
Cyanocobalamin injection given to right deltoid.  Patient tolerated well. 

## 2019-03-09 DIAGNOSIS — H40033 Anatomical narrow angle, bilateral: Secondary | ICD-10-CM | POA: Diagnosis not present

## 2019-03-09 DIAGNOSIS — H2513 Age-related nuclear cataract, bilateral: Secondary | ICD-10-CM | POA: Diagnosis not present

## 2019-03-25 ENCOUNTER — Ambulatory Visit: Payer: Medicare HMO

## 2019-03-26 ENCOUNTER — Ambulatory Visit: Payer: Medicare HMO

## 2019-03-26 ENCOUNTER — Other Ambulatory Visit: Payer: Self-pay

## 2019-03-29 ENCOUNTER — Ambulatory Visit (INDEPENDENT_AMBULATORY_CARE_PROVIDER_SITE_OTHER): Payer: Medicare HMO

## 2019-03-29 ENCOUNTER — Other Ambulatory Visit: Payer: Self-pay

## 2019-03-29 DIAGNOSIS — E538 Deficiency of other specified B group vitamins: Secondary | ICD-10-CM

## 2019-03-29 NOTE — Progress Notes (Signed)
Cyanocobalamin injection given to left arm.  Patient tolerated well. 

## 2019-03-30 ENCOUNTER — Ambulatory Visit: Payer: Medicare HMO

## 2019-04-12 ENCOUNTER — Other Ambulatory Visit: Payer: Self-pay | Admitting: Nurse Practitioner

## 2019-04-12 DIAGNOSIS — I10 Essential (primary) hypertension: Secondary | ICD-10-CM

## 2019-04-12 DIAGNOSIS — G629 Polyneuropathy, unspecified: Secondary | ICD-10-CM

## 2019-04-12 DIAGNOSIS — F411 Generalized anxiety disorder: Secondary | ICD-10-CM

## 2019-04-20 ENCOUNTER — Ambulatory Visit (INDEPENDENT_AMBULATORY_CARE_PROVIDER_SITE_OTHER): Payer: Medicare HMO | Admitting: *Deleted

## 2019-04-20 DIAGNOSIS — Z0000 Encounter for general adult medical examination without abnormal findings: Secondary | ICD-10-CM

## 2019-04-20 NOTE — Patient Instructions (Signed)
Preventive Care 81 Years and Older, Female Preventive care refers to lifestyle choices and visits with your health care provider that can promote health and wellness. This includes:  A yearly physical exam. This is also called an annual well check.  Regular dental and eye exams.  Immunizations.  Screening for certain conditions.  Healthy lifestyle choices, such as diet and exercise. What can I expect for my preventive care visit? Physical exam Your health care provider will check:  Height and weight. These may be used to calculate body mass index (BMI), which is a measurement that tells if you are at a healthy weight.  Heart rate and blood pressure.  Your skin for abnormal spots. Counseling Your health care provider may ask you questions about:  Alcohol, tobacco, and drug use.  Emotional well-being.  Home and relationship well-being.  Sexual activity.  Eating habits.  History of falls.  Memory and ability to understand (cognition).  Work and work Statistician.  Pregnancy and menstrual history. What immunizations do I need?  Influenza (flu) vaccine  This is recommended every year. Tetanus, diphtheria, and pertussis (Tdap) vaccine  You may need a Td booster every 10 years. Varicella (chickenpox) vaccine  You may need this vaccine if you have not already been vaccinated. Zoster (shingles) vaccine  You may need this after age 33. Pneumococcal conjugate (PCV13) vaccine  One dose is recommended after age 33. Pneumococcal polysaccharide (PPSV23) vaccine  One dose is recommended after age 72. Measles, mumps, and rubella (MMR) vaccine  You may need at least one dose of MMR if you were born in 1957 or later. You may also need a second dose. Meningococcal conjugate (MenACWY) vaccine  You may need this if you have certain conditions. Hepatitis A vaccine  You may need this if you have certain conditions or if you travel or work in places where you may be exposed  to hepatitis A. Hepatitis B vaccine  You may need this if you have certain conditions or if you travel or work in places where you may be exposed to hepatitis B. Haemophilus influenzae type b (Hib) vaccine  You may need this if you have certain conditions. You may receive vaccines as individual doses or as more than one vaccine together in one shot (combination vaccines). Talk with your health care provider about the risks and benefits of combination vaccines. What tests do I need? Blood tests  Lipid and cholesterol levels. These may be checked every 5 years, or more frequently depending on your overall health.  Hepatitis C test.  Hepatitis B test. Screening  Lung cancer screening. You may have this screening every year starting at age 39 if you have a 30-pack-year history of smoking and currently smoke or have quit within the past 15 years.  Colorectal cancer screening. All adults should have this screening starting at age 36 and continuing until age 15. Your health care provider may recommend screening at age 23 if you are at increased risk. You will have tests every 1-10 years, depending on your results and the type of screening test.  Diabetes screening. This is done by checking your blood sugar (glucose) after you have not eaten for a while (fasting). You may have this done every 1-3 years.  Mammogram. This may be done every 1-2 years. Talk with your health care provider about how often you should have regular mammograms.  BRCA-related cancer screening. This may be done if you have a family history of breast, ovarian, tubal, or peritoneal cancers.  Other tests  Sexually transmitted disease (STD) testing.  Bone density scan. This is done to screen for osteoporosis. You may have this done starting at age 44. Follow these instructions at home: Eating and drinking  Eat a diet that includes fresh fruits and vegetables, whole grains, lean protein, and low-fat dairy products. Limit  your intake of foods with high amounts of sugar, saturated fats, and salt.  Take vitamin and mineral supplements as recommended by your health care provider.  Do not drink alcohol if your health care provider tells you not to drink.  If you drink alcohol: ? Limit how much you have to 0-1 drink a day. ? Be aware of how much alcohol is in your drink. In the [place] one drink equals one 12 oz bottle of beer (355 mL), one 5 oz glass of wine (148 mL), or one 1 oz glass of hard liquor (44 mL). Lifestyle  Take daily care of your teeth and gums.  Stay active. Exercise for at least 30 minutes on 5 or more days each week.  Do not use any products that contain nicotine or tobacco, such as cigarettes, e-cigarettes, and chewing tobacco. If you need help quitting, ask your health care provider.  If you are sexually active, practice safe sex. Use a condom or other form of protection in order to prevent STIs (sexually transmitted infections).  Talk with your health care provider about taking a low-dose aspirin or statin. What's next?  Go to your health care provider once a year for a well check visit.  Ask your health care provider how often you should have your eyes and teeth checked.  Stay up to date on all vaccines. This information is not intended to replace advice given to you by your health care provider. Make sure you discuss any questions you have with your health care provider. Document Revised: 03/19/2018 Document Reviewed: 03/19/2018 Elsevier Patient Education  2020 [employer].

## 2019-04-20 NOTE — Progress Notes (Addendum)
MEDICARE ANNUAL WELLNESS VISIT  04/20/2019  Telephone Visit Disclaimer This Medicare AWV was conducted by telephone due to national recommendations for restrictions regarding the COVID-19 Pandemic (e.g. social distancing).  I verified, using two identifiers, that I am speaking with Denise Stewart or their authorized healthcare agent. I discussed the limitations, risks, security, and privacy concerns of performing an evaluation and management service by telephone and the potential availability of an in-person appointment in the future. The patient expressed understanding and agreed to proceed.   Subjective:  Denise Stewart is a 79 y.o. female patient of Bennie Pierini, FNP who had a Medicare Annual Wellness Visit today via telephone. Denise Stewart is Retired and lives alone but her daughter checks on her daily. she has 2 children. she reports that she is socially active and does interact with friends/family regularly. she is minimally physically active and enjoys doing jigsaw puzzles, word search puzzles and gardening.  Patient Care Team: Bennie Pierini, FNP as PCP - General (Nurse Practitioner)  Advanced Directives 04/20/2019 05/13/2017 03/18/2014  Does Patient Have a Medical Advance Directive? Yes No No  Type of Estate agent of [place];Living will - -  Does patient want to make changes to medical advance directive? No - Patient declined - -  Copy of Healthcare Power of Attorney in Chart? No - copy requested - -  Would patient like information on creating a medical advance directive? - Yes (MAU/Ambulatory/Procedural Areas - Information given) Yes - Educational materials given    Hospital Utilization Over the Past 12 Months: # of hospitalizations or ER visits: 0 # of surgeries: 0  Review of Systems    Patient reports that her overall health is unchanged compared to last year.  History obtained from chart review  Patient Reported Readings (BP, Pulse, CBG,  Weight, etc) none  Pain Assessment Pain : No/denies pain     Current Medications & Allergies (verified) Allergies as of 04/20/2019       Reactions   Celexa [citalopram Hydrobromide] Anaphylaxis, Shortness Of Breath, Swelling   Zocor [simvastatin]    myalsia   Pravachol [pravastatin Sodium] Rash        Medication List        Accurate as of April 20, 2019  1:41 PM. If you have any questions, ask your nurse or doctor.          amLODipine 5 MG tablet Commonly known as: NORVASC TAKE ONE (1) TABLET EACH DAY   aspirin 81 MG tablet Take 81 mg by mouth daily.   calcium carbonate 600 MG Tabs tablet Commonly known as: OS-CAL Take 600 mg by mouth 2 (two) times daily with a meal.   citalopram 20 MG tablet Commonly known as: CELEXA TAKE ONE (1) TABLET EACH DAY   clonazePAM 0.5 MG tablet Commonly known as: KLONOPIN TAKE ONE TABLET BY MOUTH TWICE DAILY   fish oil-omega-3 fatty acids 1000 MG capsule Take 2 g by mouth daily.   gabapentin 100 MG capsule Commonly known as: NEURONTIN TAKE ONE (1) CAPSULE THREE (3) TIMES EACH DAY   hydrochlorothiazide 25 MG tablet Commonly known as: HYDRODIURIL TAKE ONE (1) TABLET EACH DAY   levothyroxine 88 MCG tablet Commonly known as: SYNTHROID Take 1 tablet (88 mcg total) by mouth daily.   lisinopril 40 MG tablet Commonly known as: ZESTRIL TAKE ONE (1) TABLET EACH DAY   metoprolol succinate 100 MG 24 hr tablet Commonly known as: TOPROL-XL Take 1 tablet (100 mg total) by mouth daily. with food  Vitamin D-3 25 MCG (1000 UT) Caps Take 1,000 Units by mouth daily.        History (reviewed): Past Medical History:  Diagnosis Date   Depression    GAD (generalized anxiety disorder)    Hyperlipidemia    Hypertension    Thyroid disease    Past Surgical History:  Procedure Laterality Date   EYE SURGERY Right 1970   right -UNDER eye lesion removed    Family History  Problem Relation Age of Onset   Hypertension Mother     Heart attack Mother 66   Heart disease Mother    Clotting disorder Father 41   Stroke Brother    Suicidality Sister    Other Sister        train accident   Cancer Sister        liver cancer   Diabetes Brother    Other Brother        storm accident   Diabetes Brother        leg removed    Cancer Brother        lung   Cancer Brother        lymphoma   Social History   Socioeconomic History   Marital status: Widowed    Spouse name: Not on file   Number of children: 2   Years of education: 10   Highest education level: 10th grade  Occupational History   Occupation: retired    Associate Professor: [employer]  Tobacco Use   Smoking status: Current Every Day Smoker    Packs/day: 0.50    Years: 64.00    Pack years: 32.00    Types: Cigarettes    Start date: 04/08/1953   Smokeless tobacco: Never Used  Substance and Sexual Activity   Alcohol use: No   Drug use: No   Sexual activity: Not Currently    Birth control/protection: Post-menopausal  Other Topics Concern   Not on file  Social History Narrative   Not on file   Social Determinants of Health   Financial Resource Strain: Low Risk    Difficulty of Paying Living Expenses: Not hard at all  Food Insecurity: No Food Insecurity   Worried About Programme researcher, broadcasting/film/video in the Last Year: Never true   Ran Out of Food in the Last Year: Never true  Transportation Needs: No Transportation Needs   Lack of Transportation (Medical): No   Lack of Transportation (Non-Medical): No  Physical Activity: Insufficiently Active   Days of Exercise per Week: 3 days   Minutes of Exercise per Session: 20 min  Stress: No Stress Concern Present   Feeling of Stress : Not at all  Social Connections: Somewhat Isolated   Frequency of Communication with Friends and Family: More than three times a week   Frequency of Social Gatherings with Friends and Family: More than three times a week   Attends Religious Services: More than 4 times per year    Active Member of [employer] or Organizations: No   Attends Banker Meetings: Never   Marital Status: Widowed    Activities of Daily Living In your present state of health, do you have any difficulty performing the following activities: 04/20/2019  Hearing? N  Vision? N  Comment wears glasses-gets yearly eye exam  Difficulty concentrating or making decisions? N  Walking or climbing stairs? N  Dressing or bathing? N  Doing errands, shopping? Y  Comment daughter likes to drive her to appointments and help her with  her errands  Conservation officer, nature and eating ? N  Using the Toilet? N  In the past six months, have you accidently leaked urine? N  Do you have problems with loss of bowel control? N  Managing your Medications? Y  Comment uses a weekly pill box  Managing your Finances? N  Housekeeping or managing your Housekeeping? N  Some recent data might be hidden    Patient Education/ Literacy How often do you need to have someone help you when you read instructions, pamphlets, or other written materials from your doctor or pharmacy?: 1 - Never What is the last grade level you completed in school?: 10th grade  Exercise Current Exercise Habits: Home exercise routine, Type of exercise: walking, Time (Minutes): 20, Frequency (Times/Week): 3, Weekly Exercise (Minutes/Week): 60, Intensity: Mild, Exercise limited by: None identified  Diet Patient reports consuming 3 meals a day and 1 snack(s) a day Patient reports that her primary diet is: Regular Patient reports that she does have regular access to food.   Depression Screen PHQ 2/9 Scores 04/20/2019 10/21/2018 02/27/2018 02/03/2018 09/04/2017 05/13/2017 02/21/2017  PHQ - 2 Score 1 0 2 1 2  0 0  PHQ- 9 Score - - 4 - 3 - -     Fall Risk Fall Risk  04/20/2019 10/21/2018 02/27/2018 02/03/2018 09/04/2017  Falls in the past year? 1 1 1  Yes No  Number falls in past yr: 0 0 0 2 or more -  Injury with Fall? 1 0 0 Yes -  Comment - - - wrist  sprained -     Objective:  Denise Stewart seemed alert and oriented and she participated appropriately during our telephone visit.  Blood Pressure Weight BMI  BP Readings from Last 3 Encounters:  10/21/18 133/76  02/27/18 (!) 142/73  02/03/18 136/77   Wt Readings from Last 3 Encounters:  10/21/18 145 lb (65.8 kg)  02/27/18 158 lb (71.7 kg)  02/03/18 156 lb 12.8 oz (71.1 kg)   BMI Readings from Last 1 Encounters:  10/21/18 25.69 kg/m    *Unable to obtain current vital signs, weight, and BMI due to telephone visit type  Hearing/Vision  Denise Stewart did not seem to have difficulty with hearing/understanding during the telephone conversation Reports that she has had a formal eye exam by an eye care professional within the past year Reports that she has not had a formal hearing evaluation within the past year *Unable to fully assess hearing and vision during telephone visit type  Cognitive Function: 6CIT Screen 04/20/2019  What Year? 0 points  What month? 0 points  What time? 0 points  Count back from 20 0 points  Months in reverse 0 points  Repeat phrase 0 points  Total Score 0   (Normal:0-7, Significant for Dysfunction: >8)  Normal Cognitive Function Screening: Yes   Immunization & Health Maintenance Record Immunization History  Administered Date(s) Administered   Pneumococcal Conjugate-13 08/29/2014   Pneumococcal Polysaccharide-23 10/20/2015    Health Maintenance  Topic Date Due   MAMMOGRAM  09/19/1956   TETANUS/TDAP  09/19/1957   INFLUENZA VACCINE  11/07/2018   DEXA SCAN  Completed   PNA vac Low Risk Adult  Completed       Assessment  This is a routine wellness examination for [employer].  Health Maintenance: Due or Overdue Health Maintenance Due  Topic Date Due   MAMMOGRAM  09/19/1956   TETANUS/TDAP  09/19/1957   INFLUENZA VACCINE  11/07/2018    Denise Stewart does not need a referral  for [employer] Assistance: Care Management:   no Social  Work:    no Prescription Assistance:  no Nutrition/Diabetes Education:  no   Plan:  Personalized Goals Goals Addressed             This Visit's Progress    DIET - INCREASE WATER INTAKE       Try to drink 6-8 glasses of water daily       Personalized Health Maintenance & Screening Recommendations  Influenza vaccine  TDAP vaccine Shingles vaccine  Lung Cancer Screening Recommended: no (Low Dose CT Chest recommended if Age 13-80 years, 30 pack-year currently smoking OR have quit w/in past 15 years) Hepatitis C Screening recommended: no HIV Screening recommended: no  Advanced Directives: Written information was not prepared per patient's request.  Referrals & Orders No orders of the defined types were placed in this encounter.   Follow-up Plan Follow-up with Bennie Pierini, FNP as planned Consider Flu, TDAP and Shingles vaccines at your next visit with your PCP   I have personally reviewed and noted the following in the patient's chart:   Medical and social history Use of alcohol, tobacco or illicit drugs  Current medications and supplements Functional ability and status Nutritional status Physical activity Advanced directives List of other physicians Hospitalizations, surgeries, and ER visits in previous 12 months Vitals Screenings to include cognitive, depression, and falls Referrals and appointments  In addition, I have reviewed and discussed with Denise Stewart certain preventive protocols, quality metrics, and best practice recommendations. A written personalized care plan for preventive services as well as general preventive health recommendations is available and can be mailed to the patient at her request.      Hessie Diener, LPN  [id number]/70/4888   I have reviewed and agree with the above AWV documentation.   Mary-Margaret Daphine Deutscher, FNP

## 2019-04-28 ENCOUNTER — Other Ambulatory Visit: Payer: Self-pay

## 2019-04-29 ENCOUNTER — Encounter: Payer: Self-pay | Admitting: Nurse Practitioner

## 2019-04-29 ENCOUNTER — Other Ambulatory Visit: Payer: Self-pay

## 2019-04-29 ENCOUNTER — Ambulatory Visit (INDEPENDENT_AMBULATORY_CARE_PROVIDER_SITE_OTHER): Payer: Medicare HMO | Admitting: Nurse Practitioner

## 2019-04-29 VITALS — BP 143/82 | HR 62 | Temp 97.5°F | Resp 20 | Ht 63.0 in | Wt 151.0 lb

## 2019-04-29 DIAGNOSIS — E785 Hyperlipidemia, unspecified: Secondary | ICD-10-CM | POA: Diagnosis not present

## 2019-04-29 DIAGNOSIS — G629 Polyneuropathy, unspecified: Secondary | ICD-10-CM

## 2019-04-29 DIAGNOSIS — I10 Essential (primary) hypertension: Secondary | ICD-10-CM | POA: Diagnosis not present

## 2019-04-29 DIAGNOSIS — F3342 Major depressive disorder, recurrent, in full remission: Secondary | ICD-10-CM

## 2019-04-29 DIAGNOSIS — F17200 Nicotine dependence, unspecified, uncomplicated: Secondary | ICD-10-CM | POA: Diagnosis not present

## 2019-04-29 DIAGNOSIS — Z6827 Body mass index (BMI) 27.0-27.9, adult: Secondary | ICD-10-CM | POA: Diagnosis not present

## 2019-04-29 DIAGNOSIS — F411 Generalized anxiety disorder: Secondary | ICD-10-CM | POA: Diagnosis not present

## 2019-04-29 DIAGNOSIS — E039 Hypothyroidism, unspecified: Secondary | ICD-10-CM

## 2019-04-29 DIAGNOSIS — E538 Deficiency of other specified B group vitamins: Secondary | ICD-10-CM | POA: Diagnosis not present

## 2019-04-29 MED ORDER — LEVOTHYROXINE SODIUM 88 MCG PO TABS: 88.0000 ug | ORAL_TABLET | Freq: Every day | ORAL | 1 refills | Status: DC

## 2019-04-29 MED ORDER — CITALOPRAM HYDROBROMIDE 20 MG PO TABS: ORAL_TABLET | ORAL | 1 refills | Status: DC

## 2019-04-29 MED ORDER — GABAPENTIN 100 MG PO CAPS: ORAL_CAPSULE | ORAL | 1 refills | Status: DC

## 2019-04-29 MED ORDER — AMLODIPINE BESYLATE 5 MG PO TABS: ORAL_TABLET | ORAL | 1 refills | Status: DC

## 2019-04-29 MED ORDER — CLONAZEPAM 0.5 MG PO TABS: 0.5000 mg | ORAL_TABLET | Freq: Two times a day (BID) | ORAL | 2 refills | Status: DC

## 2019-04-29 MED ORDER — METOPROLOL SUCCINATE ER 100 MG PO TB24: 100.0000 mg | ORAL_TABLET | Freq: Every day | ORAL | 3 refills | Status: DC

## 2019-04-29 MED ORDER — LISINOPRIL 40 MG PO TABS: ORAL_TABLET | ORAL | 1 refills | Status: DC

## 2019-04-29 NOTE — Progress Notes (Signed)
Subjective:    Patient ID: Denise Stewart, female    DOB: 02/03/1941, 79 y.o.   MRN: [id number]   Chief Complaint: Medical Management of Chronic Issues    HPI:  1. Essential hypertension No c/o chest pain, sob or headache. Does not check blood pressure at home. BP Readings from Last 3 Encounters:  04/29/19 (!) 143/82  10/21/18 133/76  02/27/18 (!) 142/73     2. Hyperlipidemia, unspecified hyperlipidemia type Does not watch diet and does very little exercise. Lab Results  Component Value Date   CHOL 145 10/21/2018   HDL 39 (L) 10/21/2018   LDLCALC 79 10/21/2018   TRIG 135 10/21/2018   CHOLHDL 3.7 10/21/2018     3. Hypothyroidism, unspecified type No problems that she is aware of. Lab Results  Component Value Date   TSH 0.563 10/21/2018    4. GAD (generalized anxiety disorder) She is klonopin BID. Says she gets very anxious if she does not have meds. The corona virus is worrying her death. GAD 7 : Generalized Anxiety Score 04/29/2019 10/21/2018 09/04/2017  Nervous, Anxious, on Edge 1 1 0  Control/stop worrying 1 1 2   Worry too much - different things 1 1 2   Trouble relaxing 0 1 0  Restless 0 0 0  Easily annoyed or irritable 0 0 0  Afraid - awful might happen 1 0 0  Total GAD 7 Score 4 4 4   Anxiety Difficulty Not difficult at all Somewhat difficult -      5. Recurrent major depressive disorder, in full remission (HCC) Is on celexa and is doing well. Depression screen [facility] 2/9 04/29/2019 04/20/2019 10/21/2018  Decreased Interest 0 0 0  Down, Depressed, Hopeless 0 1 0  PHQ - 2 Score 0 1 0  Altered sleeping - - -  Tired, decreased energy - - -  Change in appetite - - -  Feeling bad or failure about yourself  - - -  Trouble concentrating - - -  Moving slowly or fidgety/restless - - -  Suicidal thoughts - - -  PHQ-9 Score - - -  Difficult doing work/chores - - -     6. Current smoker She smoke at least 1/2 pack a da. Says that to anxious to try to  stop.  7. Neuropathy She says her that her feet are numb sometimes. She is on neurontin daily and t helps.  8. BMI 27.0-27.9,adult No recent weight changes. Wt Readings from Last 3 Encounters:  04/29/19 151 lb (68.5 kg)  10/21/18 145 lb (65.8 kg)  02/27/18 158 lb (71.7 kg)   BMI Readings from Last 3 Encounters:  04/29/19 26.75 kg/m  10/21/18 25.69 kg/m  02/27/18 27.99 kg/m       Outpatient Encounter Medications as of 04/29/2019  Medication Sig  . amLODipine (NORVASC) 5 MG tablet TAKE ONE (1) TABLET EACH DAY  . aspirin 81 MG tablet Take 81 mg by mouth daily.  . Cholecalciferol (VITAMIN D-3) 1000 UNITS CAPS Take 1,000 Units by mouth daily.  . citalopram (CELEXA) 20 MG tablet TAKE ONE (1) TABLET EACH DAY  . clonazePAM (KLONOPIN) 0.5 MG tablet TAKE ONE TABLET BY MOUTH TWICE DAILY  . fish oil-omega-3 fatty acids 1000 MG capsule Take 2 g by mouth daily.  10/23/18 gabapentin (NEURONTIN) 100 MG capsule TAKE ONE (1) CAPSULE THREE (3) TIMES EACH DAY  . hydrochlorothiazide (HYDRODIURIL) 25 MG tablet TAKE ONE (1) TABLET EACH DAY  . levothyroxine (SYNTHROID) 88 MCG tablet Take 1 tablet (88 mcg  total) by mouth daily.  Marland Kitchen lisinopril (ZESTRIL) 40 MG tablet TAKE ONE (1) TABLET EACH DAY  . metoprolol succinate (TOPROL-XL) 100 MG 24 hr tablet Take 1 tablet (100 mg total) by mouth daily. with food  . calcium carbonate (OS-CAL) 600 MG TABS Take 600 mg by mouth 2 (two) times daily with a meal.     Past Surgical History:  Procedure Laterality Date  . EYE SURGERY Right 1970   right -UNDER eye lesion removed     Family History  Problem Relation Age of Onset  . Hypertension Mother   . Heart attack Mother 101  . Heart disease Mother   . Clotting disorder Father 100  . Stroke Brother   . Suicidality Sister   . Other Sister        train accident  . Cancer Sister        liver cancer  . Diabetes Brother   . Other Brother        storm accident  . Diabetes Brother        leg removed   . Cancer  Brother        lung  . Cancer Brother        lymphoma    New complaints: None today  Social history: Lives by herself. Family checks on her dialy  Controlled substance contract: 10/26/18    Review of Systems  Constitutional: Negative for diaphoresis.  Eyes: Negative for pain.  Respiratory: Negative for shortness of breath.   Cardiovascular: Negative for chest pain, palpitations and leg swelling.  Gastrointestinal: Negative for abdominal pain.  Endocrine: Negative for polydipsia.  Skin: Negative for rash.  Neurological: Negative for dizziness, weakness and headaches.  Hematological: Does not bruise/bleed easily.  All other systems reviewed and are negative.      Objective:   Physical Exam Vitals and nursing note reviewed.  Constitutional:      General: She is not in acute distress.    Appearance: Normal appearance. She is well-developed.  HENT:     Head: Normocephalic.     Nose: Nose normal.  Eyes:     Pupils: Pupils are equal, round, and reactive to light.  Neck:     Vascular: No carotid bruit or JVD.  Cardiovascular:     Rate and Rhythm: Normal rate and regular rhythm.     Heart sounds: Normal heart sounds.  Pulmonary:     Effort: Pulmonary effort is normal. No respiratory distress.     Breath sounds: Normal breath sounds. No wheezing or rales.  Chest:     Chest wall: No tenderness.  Abdominal:     General: Bowel sounds are normal. There is no distension or abdominal bruit.     Palpations: Abdomen is soft. There is no hepatomegaly, splenomegaly, mass or pulsatile mass.     Tenderness: There is no abdominal tenderness.  Musculoskeletal:        General: Normal range of motion.     Cervical back: Normal range of motion and neck supple.  Lymphadenopathy:     Cervical: No cervical adenopathy.  Skin:    General: Skin is warm and dry.  Neurological:     Mental Status: She is alert and oriented to person, place, and time.     Deep Tendon Reflexes: Reflexes are  normal and symmetric.  Psychiatric:        Behavior: Behavior normal.        Thought Content: Thought content normal.  Judgment: Judgment normal.    BP (!) 143/82   Pulse 62   Temp (!) 97.5 F (36.4 C) (Temporal)   Resp 20   Ht 5\' 3"  (1.6 m)   Wt 151 lb (68.5 kg)   SpO2 95%   BMI 26.75 kg/m         Assessment & Plan:  Denise Stewart comes in today with chief complaint of Medical Management of Chronic Issues   Diagnosis and orders addressed:  1. Essential hypertension Low sodium diet - amLODipine (NORVASC) 5 MG tablet; TAKE ONE (1) TABLET EACH DAY  Dispense: 90 tablet; Refill: 1 - metoprolol succinate (TOPROL-XL) 100 MG 24 hr tablet; Take 1 tablet (100 mg total) by mouth daily. with food  Dispense: 90 tablet; Refill: 3 - lisinopril (ZESTRIL) 40 MG tablet; TAKE ONE (1) TABLET EACH DAY  Dispense: 90 tablet; Refill: 1  2. Hyperlipidemia, unspecified hyperlipidemia type Low fat diet  3. Hypothyroidism, unspecified type - levothyroxine (SYNTHROID) 88 MCG tablet; Take 1 tablet (88 mcg total) by mouth daily.  Dispense: 90 tablet; Refill: 1  4. GAD (generalized anxiety disorder) Stress management - clonazePAM (KLONOPIN) 0.5 MG tablet; Take 1 tablet (0.5 mg total) by mouth 2 (two) times daily.  Dispense: 60 tablet; Refill: 2  5. Recurrent major depressive disorder, in full remission ([place])  - citalopram (CELEXA) 20 MG tablet; TAKE ONE (1) TABLET EACH DAY  Dispense: 90 tablet; Refill: 1  6. Current smoker Smoking cessation encouraged  7. Neuropathy Do not go bareooted - gabapentin (NEURONTIN) 100 MG capsule; TAKE ONE (1) CAPSULE THREE (3) TIMES EACH DAY  Dispense: 270 capsule; Refill: 1  8. BMI 27.0-27.9,adult Discussed diet and exercise for person with BMI >25 Will recheck weight in 3-6 months   Labs pending Health Maintenance reviewed Diet and exercise encouraged  Follow up plan: 18months   Mary-Margaret Hassell Done, FNP

## 2019-04-29 NOTE — Patient Instructions (Signed)
COVID-19 Vaccine Information can be found at: [email or web address] For questions related to vaccine distribution or appointments, please email [email or web address] or call [phone].    

## 2019-04-30 LAB — CMP14+EGFR
ALT: 9 IU/L (ref 0–32)
AST: 14 IU/L (ref 0–40)
Albumin/Globulin Ratio: 1.6 (ref 1.2–2.2)
Albumin: 4.2 g/dL (ref 3.7–4.7)
Alkaline Phosphatase: 98 IU/L (ref 39–117)
BUN/Creatinine Ratio: 13 (ref 12–28)
BUN: 14 mg/dL (ref 8–27)
Bilirubin Total: 0.6 mg/dL (ref 0.0–1.2)
CO2: 26 mmol/L (ref 20–29)
Calcium: 9 mg/dL (ref 8.7–10.3)
Chloride: 100 mmol/L (ref 96–106)
Creatinine, Ser: 1.1 mg/dL — ABNORMAL HIGH (ref 0.57–1.00)
GFR calc Af Amer: 55 mL/min/{1.73_m2} — ABNORMAL LOW (ref >59)
GFR calc non Af Amer: 48 mL/min/{1.73_m2} — ABNORMAL LOW (ref >59)
Globulin, Total: 2.7 g/dL (ref 1.5–4.5)
Glucose: 85 mg/dL (ref 65–99)
Potassium: 3.9 mmol/L (ref 3.5–5.2)
Sodium: 140 mmol/L (ref 134–144)
Total Protein: 6.9 g/dL (ref 6.0–8.5)

## 2019-04-30 LAB — LIPID PANEL
Chol/HDL Ratio: 3.8 ratio (ref 0.0–4.4)
Cholesterol, Total: 171 mg/dL (ref 100–199)
HDL: 45 mg/dL (ref >39)
LDL Chol Calc (NIH): 101 mg/dL — ABNORMAL HIGH (ref 0–99)
Triglycerides: 139 mg/dL (ref 0–149)
VLDL Cholesterol Cal: 25 mg/dL (ref 5–40)

## 2019-04-30 LAB — THYROID PANEL WITH TSH
Free Thyroxine Index: 2.2 (ref 1.2–4.9)
T3 Uptake Ratio: 25 % (ref 24–39)
T4, Total: 8.6 ug/dL (ref 4.5–12.0)
TSH: 1.19 u[IU]/mL (ref 0.450–4.500)

## 2019-05-28 ENCOUNTER — Other Ambulatory Visit: Payer: Self-pay

## 2019-05-31 ENCOUNTER — Other Ambulatory Visit: Payer: Self-pay

## 2019-05-31 ENCOUNTER — Ambulatory Visit (INDEPENDENT_AMBULATORY_CARE_PROVIDER_SITE_OTHER): Payer: Medicare HMO

## 2019-05-31 DIAGNOSIS — E538 Deficiency of other specified B group vitamins: Secondary | ICD-10-CM

## 2019-05-31 NOTE — Progress Notes (Signed)
Cyanocobalamin injection given to right arm.  Patient tolerated well. 

## 2019-06-17 ENCOUNTER — Encounter: Payer: Self-pay | Admitting: Family Medicine

## 2019-06-17 ENCOUNTER — Ambulatory Visit (INDEPENDENT_AMBULATORY_CARE_PROVIDER_SITE_OTHER): Payer: Medicare HMO | Admitting: Family Medicine

## 2019-06-17 DIAGNOSIS — R0981 Nasal congestion: Secondary | ICD-10-CM

## 2019-06-17 DIAGNOSIS — H9201 Otalgia, right ear: Secondary | ICD-10-CM

## 2019-06-17 MED ORDER — AMOXICILLIN 875 MG PO TABS: 875.0000 mg | ORAL_TABLET | Freq: Two times a day (BID) | ORAL | 0 refills | Status: DC

## 2019-06-17 NOTE — Progress Notes (Signed)
Virtual Visit via telephone Note Due to COVID-19 pandemic this visit was conducted virtually. This visit type was conducted due to national recommendations for restrictions regarding the COVID-19 Pandemic (e.g. social distancing, sheltering in place) in an effort to limit this patient's exposure and mitigate transmission in our community. All issues noted in this document were discussed and addressed.  A physical exam was not performed with this format.   I connected with Denise Stewart on 06/17/2019 at 20 by telephone and verified that I am speaking with the correct person using two identifiers. Denise Stewart is currently located at home and no one is currently with them during visit. The provider, Monia Pouch, FNP is located in their office at time of visit.  I discussed the limitations, risks, security and privacy concerns of performing an evaluation and management service by telephone and the availability of in person appointments. I also discussed with the patient that there may be a patient responsible charge related to this service. The patient expressed understanding and agreed to proceed.  Subjective:  Patient ID: Denise Stewart, female    DOB: 1941/01/08, 79 y.o.   MRN: [id number]  Chief Complaint:  Nasal Congestion and Otalgia   HPI: Denise Stewart is a 79 y.o. female presenting on 06/17/2019 for Nasal Congestion and Otalgia   Patient reports right otalgia for over 7 days.  States she has now developed some nasal congestion with the symptoms.  She has tried Tylenol with some relief of symptoms.  No known sick exposures or other associated symptoms.  Otalgia  There is pain in the right ear. This is a new problem. The current episode started 1 to 4 weeks ago. The problem occurs constantly. The problem has been gradually worsening. The pain is at a severity of 4/10. The pain is mild. Associated symptoms include rhinorrhea. Pertinent negatives include no abdominal pain, coughing,  diarrhea, ear discharge, headaches, hearing loss, neck pain, rash, sore throat or vomiting. She has tried acetaminophen for the symptoms. The treatment provided mild relief.     Relevant past medical, surgical, family, and social history reviewed and updated as indicated.  Allergies and medications reviewed and updated.   Past Medical History:  Diagnosis Date  . Depression   . GAD (generalized anxiety disorder)   . Hyperlipidemia   . Hypertension   . Thyroid disease     Past Surgical History:  Procedure Laterality Date  . EYE SURGERY Right 1970   right -UNDER eye lesion removed     Social History   Socioeconomic History  . Marital status: Widowed    Spouse name: Not on file  . Number of children: 2  . Years of education: 10  . Highest education level: 10th grade  Occupational History  . Occupation: retired    Fish farm manager: Art therapist  Tobacco Use  . Smoking status: Current Every Day Smoker    Packs/day: 0.50    Years: 64.00    Pack years: 32.00    Types: Cigarettes    Start date: 04/08/1953  . Smokeless tobacco: Never Used  Substance and Sexual Activity  . Alcohol use: No  . Drug use: No  . Sexual activity: Not Currently    Birth control/protection: Post-menopausal  Other Topics Concern  . Not on file  Social History Narrative  . Not on file   Social Determinants of Health   Financial Resource Strain: Low Risk   . Difficulty of Paying Living Expenses: Not hard at all  Food Insecurity: No Food  Insecurity  . Worried About Programme researcher, broadcasting/film/video in the Last Year: Never true  . Ran Out of Food in the Last Year: Never true  Transportation Needs: No Transportation Needs  . Lack of Transportation (Medical): No  . Lack of Transportation (Non-Medical): No  Physical Activity: Insufficiently Active  . Days of Exercise per Week: 3 days  . Minutes of Exercise per Session: 20 min  Stress: No Stress Concern Present  . Feeling of Stress : Not at all  Social  Connections: Somewhat Isolated  . Frequency of Communication with Friends and Family: More than three times a week  . Frequency of Social Gatherings with Friends and Family: More than three times a week  . Attends Religious Services: More than 4 times per year  . Active Member of Clubs or Organizations: No  . Attends Banker Meetings: Never  . Marital Status: Widowed  Intimate Partner Violence: Not At Risk  . Fear of Current or Ex-Partner: No  . Emotionally Abused: No  . Physically Abused: No  . Sexually Abused: No    Outpatient Encounter Medications as of 06/17/2019  Medication Sig  . amLODipine (NORVASC) 5 MG tablet TAKE ONE (1) TABLET EACH DAY  . amoxicillin (AMOXIL) 875 MG tablet Take 1 tablet (875 mg total) by mouth 2 (two) times daily. 1 po BID  . aspirin 81 MG tablet Take 81 mg by mouth daily.  . calcium carbonate (OS-CAL) 600 MG TABS Take 600 mg by mouth 2 (two) times daily with a meal.  . Cholecalciferol (VITAMIN D-3) 1000 UNITS CAPS Take 1,000 Units by mouth daily.  . citalopram (CELEXA) 20 MG tablet TAKE ONE (1) TABLET EACH DAY  . clonazePAM (KLONOPIN) 0.5 MG tablet Take 1 tablet (0.5 mg total) by mouth 2 (two) times daily.  . fish oil-omega-3 fatty acids 1000 MG capsule Take 2 g by mouth daily.  Marland Kitchen gabapentin (NEURONTIN) 100 MG capsule TAKE ONE (1) CAPSULE THREE (3) TIMES EACH DAY  . hydrochlorothiazide (HYDRODIURIL) 25 MG tablet TAKE ONE (1) TABLET EACH DAY  . levothyroxine (SYNTHROID) 88 MCG tablet Take 1 tablet (88 mcg total) by mouth daily.  Marland Kitchen lisinopril (ZESTRIL) 40 MG tablet TAKE ONE (1) TABLET EACH DAY  . metoprolol succinate (TOPROL-XL) 100 MG 24 hr tablet Take 1 tablet (100 mg total) by mouth daily. with food   Facility-Administered Encounter Medications as of 06/17/2019  Medication  . cyanocobalamin ((VITAMIN B-12)) injection 1,000 mcg    Allergies  Allergen Reactions  . Celexa [Citalopram Hydrobromide] Anaphylaxis, Shortness Of Breath and Swelling   . Zocor [Simvastatin]     myalsia  . Pravachol [Pravastatin Sodium] Rash    Review of Systems  Constitutional: Negative for activity change, appetite change, chills, diaphoresis, fatigue, fever and unexpected weight change.  HENT: Positive for congestion, ear pain and rhinorrhea. Negative for ear discharge, hearing loss, sinus pressure, sinus pain and sore throat.   Eyes: Negative.   Respiratory: Negative for cough, chest tightness and shortness of breath.   Cardiovascular: Negative for chest pain, palpitations and leg swelling.  Gastrointestinal: Negative for abdominal pain, blood in stool, constipation, diarrhea, nausea and vomiting.  Endocrine: Negative.   Genitourinary: Negative for decreased urine volume, difficulty urinating, dysuria, frequency and urgency.  Musculoskeletal: Negative for arthralgias, myalgias and neck pain.  Skin: Negative.  Negative for rash.  Allergic/Immunologic: Negative.   Neurological: Negative for dizziness, weakness and headaches.  Hematological: Negative.   Psychiatric/Behavioral: Negative for confusion, hallucinations, sleep disturbance and suicidal  ideas.  All other systems reviewed and are negative.        Observations/Objective: No vital signs or physical exam, this was a telephone or virtual health encounter.  Pt alert and oriented, answers all questions appropriately, and able to speak in full sentences.    Assessment and Plan: Denise Stewart was seen today for nasal congestion and otalgia.  Diagnoses and all orders for this visit:  Nasal congestion Symptomatic care discussed in detail. Report any new worsening, or persistent symptoms.   Acute otalgia, right Reported symptoms consistent with acute otitis media. Due to length of symptoms, will initiate below. Pt aware to take full course of mediations. Report any new, worsening, or persistent symptoms.  -     amoxicillin (AMOXIL) 875 MG tablet; Take 1 tablet (875 mg total) by mouth 2 (two) times  daily. 1 po BID     Follow Up Instructions: Return if symptoms worsen or fail to improve.    I discussed the assessment and treatment plan with the patient. The patient was provided an opportunity to ask questions and all were answered. The patient agreed with the plan and demonstrated an understanding of the instructions.   The patient was advised to call back or seek an in-person evaluation if the symptoms worsen or if the condition fails to improve as anticipated.  The above assessment and management plan was discussed with the patient. The patient verbalized understanding of and has agreed to the management plan. Patient is aware to call the clinic if they develop any new symptoms or if symptoms persist or worsen. Patient is aware when to return to the clinic for a follow-up visit. Patient educated on when it is appropriate to go to the emergency department.    I provided 15 minutes of non-face-to-face time during this encounter. The call started at 1030. The call ended at 1045. The other time was used for coordination of care.    Kari Baars, FNP-C [facility] Medicine [address] [place], [place] [address] [phone] 06/17/2019

## 2019-06-28 ENCOUNTER — Ambulatory Visit: Payer: Medicare HMO

## 2019-06-29 ENCOUNTER — Ambulatory Visit (INDEPENDENT_AMBULATORY_CARE_PROVIDER_SITE_OTHER): Payer: Medicare HMO | Admitting: *Deleted

## 2019-06-29 ENCOUNTER — Other Ambulatory Visit: Payer: Self-pay

## 2019-06-29 DIAGNOSIS — E538 Deficiency of other specified B group vitamins: Secondary | ICD-10-CM

## 2019-06-29 NOTE — Progress Notes (Signed)
Pt given B12 injection IM left deltoid and tolerated well. °

## 2019-06-29 NOTE — Patient Instructions (Signed)

## 2019-07-06 ENCOUNTER — Telehealth: Payer: Self-pay | Admitting: Nurse Practitioner

## 2019-07-06 NOTE — Chronic Care Management (AMB) (Signed)
  Chronic Care Management   Outreach Note  07/06/2019 Name: Zonie Crutcher MRN: [id number] DOB: 1940-09-04  Adanya Sosinski is a 79 y.o. year old female who is a primary care patient of Bennie Pierini, FNP. I reached out to [name] by phone today in response to a referral sent by Ms. Pattricia Boss [employer]'s health plan.     An unsuccessful telephone outreach was attempted today. The patient was referred to the case management team for assistance with care management and care coordination.   Follow Up Plan: The care management team will reach out to the patient again over the next 7 days.  If patient returns call to provider office, please advise to call Embedded Care Management Care Guide Penne Lash  at [phone]  Penne Lash, RMA Care Guide, Embedded Care Coordination [facility]  [place], [place] [address] Direct Dial: [phone] [email or web address] Website: [email or web address]

## 2019-07-15 NOTE — Chronic Care Management (AMB) (Signed)
  Chronic Care Management   Note  07/15/2019 Name: Ajooni Karam MRN: [id number] DOB: Mar 03, 1941  Denise Stewart is a 79 y.o. year old female who is a primary care patient of Chevis Pretty, [place]. I reached out to Annye English by phone today in response to a referral sent by Ms. Deneise Lever [employer]'s health plan.     Ms. Belizaire was given information about Chronic Care Management services today including:  1. CCM service includes personalized support from designated clinical staff supervised by her physician, including individualized plan of care and coordination with other care providers 2. 24/7 contact phone numbers for assistance for urgent and routine care needs. 3. Service will only be billed when office clinical staff spend 20 minutes or more in a month to coordinate care. 4. Only one practitioner may furnish and bill the service in a calendar month. 5. The patient may stop CCM services at any time (effective at the end of the month) by phone call to the office staff. 6. The patient will be responsible for cost sharing (co-pay) of up to 20% of the service fee (after annual deductible is met).  Patient did not agree to enrollment in care management services and does not wish to consider at this time.  Follow up plan: The patient has been provided with contact information for the care management team and has been advised to call with any health related questions or concerns.   Noreene Larsson, [place], [place], [place] [address] Direct Dial: [phone] [email or web address] Website: [email or web address]

## 2019-07-30 ENCOUNTER — Ambulatory Visit (INDEPENDENT_AMBULATORY_CARE_PROVIDER_SITE_OTHER): Payer: Medicare HMO

## 2019-07-30 ENCOUNTER — Other Ambulatory Visit: Payer: Self-pay

## 2019-07-30 DIAGNOSIS — E538 Deficiency of other specified B group vitamins: Secondary | ICD-10-CM

## 2019-07-30 NOTE — Progress Notes (Signed)
Cyanocobalamin injection given to right deltoid.  Patient tolerated well. 

## 2019-08-13 ENCOUNTER — Other Ambulatory Visit: Payer: Self-pay | Admitting: Nurse Practitioner

## 2019-08-13 DIAGNOSIS — I10 Essential (primary) hypertension: Secondary | ICD-10-CM

## 2019-08-30 ENCOUNTER — Ambulatory Visit (INDEPENDENT_AMBULATORY_CARE_PROVIDER_SITE_OTHER): Payer: Medicare HMO | Admitting: *Deleted

## 2019-08-30 ENCOUNTER — Other Ambulatory Visit: Payer: Self-pay

## 2019-08-30 DIAGNOSIS — E538 Deficiency of other specified B group vitamins: Secondary | ICD-10-CM | POA: Diagnosis not present

## 2019-08-30 NOTE — Progress Notes (Signed)
Patient in today for B12 injection. 1000 mcg given IM in left deltoid. Patient tolerated well.  

## 2019-09-28 ENCOUNTER — Other Ambulatory Visit: Payer: Self-pay | Admitting: Nurse Practitioner

## 2019-09-28 DIAGNOSIS — I10 Essential (primary) hypertension: Secondary | ICD-10-CM

## 2019-10-04 ENCOUNTER — Other Ambulatory Visit: Payer: Self-pay

## 2019-10-04 ENCOUNTER — Ambulatory Visit (INDEPENDENT_AMBULATORY_CARE_PROVIDER_SITE_OTHER): Payer: Medicare HMO

## 2019-10-04 DIAGNOSIS — E538 Deficiency of other specified B group vitamins: Secondary | ICD-10-CM | POA: Diagnosis not present

## 2019-10-04 NOTE — Progress Notes (Signed)
B12 injection given and tolerated well.  

## 2019-10-24 DIAGNOSIS — R0781 Pleurodynia: Secondary | ICD-10-CM | POA: Diagnosis not present

## 2019-10-24 DIAGNOSIS — M25522 Pain in left elbow: Secondary | ICD-10-CM | POA: Diagnosis not present

## 2019-10-24 DIAGNOSIS — J90 Pleural effusion, not elsewhere classified: Secondary | ICD-10-CM | POA: Diagnosis not present

## 2019-10-24 DIAGNOSIS — S59902A Unspecified injury of left elbow, initial encounter: Secondary | ICD-10-CM | POA: Diagnosis not present

## 2019-10-24 DIAGNOSIS — S2242XA Multiple fractures of ribs, left side, initial encounter for closed fracture: Secondary | ICD-10-CM | POA: Diagnosis not present

## 2019-10-28 ENCOUNTER — Ambulatory Visit: Payer: Self-pay | Admitting: Nurse Practitioner

## 2019-11-01 DIAGNOSIS — S42292A Other displaced fracture of upper end of left humerus, initial encounter for closed fracture: Secondary | ICD-10-CM | POA: Diagnosis not present

## 2019-11-02 ENCOUNTER — Telehealth: Payer: Self-pay | Admitting: Nurse Practitioner

## 2019-11-02 NOTE — Telephone Encounter (Signed)
Pt is coming in at 3:00pm on 7/29

## 2019-11-02 NOTE — Telephone Encounter (Signed)
Pts daughter called to see if [facility] can order for pt to have xrays done of her ribs. Says pt was recently seen at Urgent Care for a fall and was told that she may have some broken ribs. Daughter says pt is coming to our office on 11/04/19 for a B12 shot and wants to know if pt can have xray while at office.

## 2019-11-04 ENCOUNTER — Other Ambulatory Visit: Payer: Self-pay

## 2019-11-04 ENCOUNTER — Ambulatory Visit (INDEPENDENT_AMBULATORY_CARE_PROVIDER_SITE_OTHER): Payer: Medicare HMO

## 2019-11-04 DIAGNOSIS — E538 Deficiency of other specified B group vitamins: Secondary | ICD-10-CM | POA: Diagnosis not present

## 2019-11-04 NOTE — Progress Notes (Signed)
Cyanocobalamin injection given to right deltoid.  Patient tolerated well. 

## 2019-11-09 ENCOUNTER — Other Ambulatory Visit: Payer: Self-pay | Admitting: Nurse Practitioner

## 2019-11-09 DIAGNOSIS — I10 Essential (primary) hypertension: Secondary | ICD-10-CM

## 2019-11-29 DIAGNOSIS — S42213D Unspecified displaced fracture of surgical neck of unspecified humerus, subsequent encounter for fracture with routine healing: Secondary | ICD-10-CM | POA: Insufficient documentation

## 2019-11-29 DIAGNOSIS — S42212D Unspecified displaced fracture of surgical neck of left humerus, subsequent encounter for fracture with routine healing: Secondary | ICD-10-CM | POA: Diagnosis not present

## 2019-12-07 ENCOUNTER — Ambulatory Visit (INDEPENDENT_AMBULATORY_CARE_PROVIDER_SITE_OTHER): Payer: Medicare HMO | Admitting: *Deleted

## 2019-12-07 ENCOUNTER — Other Ambulatory Visit: Payer: Self-pay

## 2019-12-07 DIAGNOSIS — E538 Deficiency of other specified B group vitamins: Secondary | ICD-10-CM | POA: Diagnosis not present

## 2019-12-07 NOTE — Patient Instructions (Signed)

## 2019-12-07 NOTE — Progress Notes (Signed)
Pt given B12 injection IM left deltoid and tolerated well. °

## 2019-12-20 ENCOUNTER — Other Ambulatory Visit: Payer: Self-pay | Admitting: Nurse Practitioner

## 2019-12-20 DIAGNOSIS — I10 Essential (primary) hypertension: Secondary | ICD-10-CM

## 2020-01-06 ENCOUNTER — Other Ambulatory Visit: Payer: Self-pay

## 2020-01-06 ENCOUNTER — Encounter: Payer: Self-pay | Admitting: Nurse Practitioner

## 2020-01-06 ENCOUNTER — Ambulatory Visit (INDEPENDENT_AMBULATORY_CARE_PROVIDER_SITE_OTHER): Payer: Medicare HMO | Admitting: Nurse Practitioner

## 2020-01-06 VITALS — BP 145/85 | HR 63 | Temp 97.9°F | Ht 63.0 in | Wt 150.6 lb

## 2020-01-06 DIAGNOSIS — Z6826 Body mass index (BMI) 26.0-26.9, adult: Secondary | ICD-10-CM | POA: Diagnosis not present

## 2020-01-06 DIAGNOSIS — F411 Generalized anxiety disorder: Secondary | ICD-10-CM

## 2020-01-06 DIAGNOSIS — F3342 Major depressive disorder, recurrent, in full remission: Secondary | ICD-10-CM

## 2020-01-06 DIAGNOSIS — E782 Mixed hyperlipidemia: Secondary | ICD-10-CM

## 2020-01-06 DIAGNOSIS — E039 Hypothyroidism, unspecified: Secondary | ICD-10-CM | POA: Diagnosis not present

## 2020-01-06 DIAGNOSIS — I10 Essential (primary) hypertension: Secondary | ICD-10-CM | POA: Diagnosis not present

## 2020-01-06 DIAGNOSIS — F17200 Nicotine dependence, unspecified, uncomplicated: Secondary | ICD-10-CM

## 2020-01-06 DIAGNOSIS — G629 Polyneuropathy, unspecified: Secondary | ICD-10-CM

## 2020-01-06 MED ORDER — CITALOPRAM HYDROBROMIDE 20 MG PO TABS: ORAL_TABLET | ORAL | 1 refills | Status: DC

## 2020-01-06 MED ORDER — GABAPENTIN 100 MG PO CAPS: ORAL_CAPSULE | ORAL | 1 refills | Status: DC

## 2020-01-06 MED ORDER — HYDROCHLOROTHIAZIDE 25 MG PO TABS: ORAL_TABLET | ORAL | 1 refills | Status: DC

## 2020-01-06 MED ORDER — LEVOTHYROXINE SODIUM 88 MCG PO TABS: 88.0000 ug | ORAL_TABLET | Freq: Every day | ORAL | 1 refills | Status: DC

## 2020-01-06 MED ORDER — AMLODIPINE BESYLATE 5 MG PO TABS: ORAL_TABLET | ORAL | 1 refills | Status: DC

## 2020-01-06 MED ORDER — METOPROLOL SUCCINATE ER 100 MG PO TB24: 100.0000 mg | ORAL_TABLET | Freq: Every day | ORAL | 1 refills | Status: DC

## 2020-01-06 MED ORDER — CLONAZEPAM 0.5 MG PO TABS: 0.5000 mg | ORAL_TABLET | Freq: Two times a day (BID) | ORAL | 2 refills | Status: DC

## 2020-01-06 MED ORDER — LISINOPRIL 40 MG PO TABS: ORAL_TABLET | ORAL | 1 refills | Status: DC

## 2020-01-06 NOTE — Progress Notes (Signed)
Subjective:    Patient ID: Denise Stewart, female    DOB: 20-Feb-1941, 79 y.o.   MRN: [id number]   Chief Complaint: Medical Management of Chronic Issues    HPI:  1. Essential hypertension No c/o chest pain, sob or headache. Does not check blood pressure at home. BP Readings from Last 3 Encounters:  04/29/19 (!) 143/82  10/21/18 133/76  02/27/18 (!) 142/73     2. Mixed hyperlipidemia Does not watch diet and does no exercise Lab Results  Component Value Date   CHOL 171 04/29/2019   HDL 45 04/29/2019   LDLCALC 101 (H) 04/29/2019   TRIG 139 04/29/2019   CHOLHDL 3.8 04/29/2019     3. Hypothyroidism, unspecified type No problems that she is aware of. Lab Results  Component Value Date   TSH 1.190 04/29/2019     4. Recurrent major depressive disorder, in full remission ([place]) Is on celexa  Daily and says she is doing well. Depression screen [facility] 2/9 01/06/2020 04/29/2019 04/20/2019  Decreased Interest 0 0 0  Down, Depressed, Hopeless 0 0 1  PHQ - 2 Score 0 0 1  Altered sleeping - - -  Tired, decreased energy - - -  Change in appetite - - -  Feeling bad or failure about yourself  - - -  Trouble concentrating - - -  Moving slowly or fidgety/restless - - -  Suicidal thoughts - - -  PHQ-9 Score - - -  Difficult doing work/chores - - -     5. GAD (generalized anxiety disorder) Is on klonopin daily and is doing well. Says that she has been a nervous person her entire life.  GAD 7 : Generalized Anxiety Score 01/06/2020 04/29/2019 10/21/2018 09/04/2017  Nervous, Anxious, on Edge _0 0  Control/stop worrying _1 Worry too much - different things _2 Trouble relaxing - 0 1 0  Restless 0 0 0 0  Easily annoyed or irritable 0 0 0 0  Afraid - awful might happen 0 1 0 0  Total GAD 7 Score - _3 Anxiety Difficulty Somewhat difficult Not difficult at all Somewhat difficult -       6. Current smoker Smokes at least 1/2 pack a day  7. Neuropathy Is on  gabapentin for neuropathy in her feet. Right foot worse then left. Pain bad someday sand make sit difficult to walk.  8. BMI 27.0-27.9,adult Weight I sup 6lbs from previous visit Wt Readings from Last 3 Encounters:  04/29/19 151 lb (68.5 kg)  10/21/18 145 lb (65.8 kg)  02/27/18 158 lb (71.7 kg)   BMI Readings from Last 3 Encounters:  04/29/19 26.75 kg/m  10/21/18 25.69 kg/m  02/27/18 27.99 kg/m       Outpatient Encounter Medications as of 01/06/2020  Medication Sig  . amLODipine (NORVASC) 5 MG tablet TAKE ONE (1) TABLET EACH DAY  . aspirin 81 MG tablet Take 81 mg by mouth daily.  . calcium carbonate (OS-CAL) 600 MG TABS Take 600 mg by mouth 2 (two) times daily with a meal.  . Cholecalciferol (VITAMIN D-3) 1000 UNITS CAPS Take 1,000 Units by mouth daily.  . citalopram (CELEXA) 20 MG tablet TAKE ONE (1) TABLET EACH DAY  . clonazePAM (KLONOPIN) 0.5 MG tablet Take 1 tablet (0.5 mg total) by mouth 2 (two) times daily.  . fish oil-omega-3 fatty acids 1000 MG capsule Take 2 g by mouth daily.  Marland Kitchen gabapentin (NEURONTIN) 100  MG capsule TAKE ONE (1) CAPSULE THREE (3) TIMES EACH DAY  . hydrochlorothiazide (HYDRODIURIL) 25 MG tablet TAKE ONE (1) TABLET EACH DAY  . levothyroxine (SYNTHROID) 88 MCG tablet Take 1 tablet (88 mcg total) by mouth daily.  Marland Kitchen lisinopril (ZESTRIL) 40 MG tablet TAKE ONE (1) TABLET EACH DAY  . metoprolol succinate (TOPROL-XL) 100 MG 24 hr tablet Take 1 tablet (100 mg total) by mouth daily. with food     Past Surgical History:  Procedure Laterality Date  . EYE SURGERY Right 1970   right -UNDER eye lesion removed     Family History  Problem Relation Age of Onset  . Hypertension Mother   . Heart attack Mother 12  . Heart disease Mother   . Clotting disorder Father 100  . Stroke Brother   . Suicidality Sister   . Other Sister        train accident  . Cancer Sister        liver cancer  . Diabetes Brother   . Other Brother        storm accident  . Diabetes  Brother        leg removed   . Cancer Brother        lung  . Cancer Brother        lymphoma    New complaints: None today  Social history: Lives by herself. Family checks on her daily  Controlled substance contract: 01/06/20    Review of Systems  Constitutional: Negative for diaphoresis.  Eyes: Negative for pain.  Respiratory: Negative for shortness of breath.   Cardiovascular: Negative for chest pain, palpitations and leg swelling.  Gastrointestinal: Negative for abdominal pain.  Endocrine: Negative for polydipsia.  Musculoskeletal: Positive for arthralgias (left shoulder- old injury).  Skin: Negative for rash.  Neurological: Negative for dizziness, weakness and headaches.  Hematological: Does not bruise/bleed easily.  All other systems reviewed and are negative.      Objective:   Physical Exam Vitals and nursing note reviewed.  Constitutional:      General: She is not in acute distress.    Appearance: Normal appearance. She is well-developed.  HENT:     Head: Normocephalic.     Nose: Nose normal.  Eyes:     Pupils: Pupils are equal, round, and reactive to light.  Neck:     Vascular: No carotid bruit or JVD.  Cardiovascular:     Rate and Rhythm: Normal rate and regular rhythm.     Heart sounds: Normal heart sounds.  Pulmonary:     Effort: Pulmonary effort is normal. No respiratory distress.     Breath sounds: Normal breath sounds. No wheezing or rales.  Chest:     Chest wall: No tenderness.  Abdominal:     General: Bowel sounds are normal. There is no distension or abdominal bruit.     Palpations: Abdomen is soft. There is no hepatomegaly, splenomegaly, mass or pulsatile mass.     Tenderness: There is no abdominal tenderness.  Musculoskeletal:        General: Normal range of motion.     Cervical back: Normal range of motion and neck supple.  Lymphadenopathy:     Cervical: No cervical adenopathy.  Skin:    General: Skin is warm and dry.  Neurological:      Mental Status: She is alert and oriented to person, place, and time.     Deep Tendon Reflexes: Reflexes are normal and symmetric.  Psychiatric:  Behavior: Behavior normal.        Thought Content: Thought content normal.        Judgment: Judgment normal.    BP (!) 145/85   Pulse 63   Temp 97.9 F (36.6 C) (Oral)   Ht _0  (1.6 m)   Wt 150 lb 9.6 oz (68.3 kg)   BMI 26.68 kg/m          Assessment & Plan:  Denise Stewart comes in today with chief complaint of Medical Management of Chronic Issues   Diagnosis and orders addressed:  1. Essential hypertension Low sodium diet - amLODipine (NORVASC) 5 MG tablet; TAKE ONE (1) TABLET EACH DAY  Dispense: 30 tablet; Refill: 1 - metoprolol succinate (TOPROL-XL) 100 MG 24 hr tablet; Take 1 tablet (100 mg total) by mouth daily. with food  Dispense: 90 tablet; Refill: 1 - lisinopril (ZESTRIL) 40 MG tablet; TAKE ONE (1) TABLET EACH DAY  Dispense: 90 tablet; Refill: 1 - hydrochlorothiazide (HYDRODIURIL) 25 MG tablet; TAKE ONE (1) TABLET EACH DAY  Dispense: 90 tablet; Refill: 1 - CBC with Differential/Platelet - CMP14+EGFR  2. Mixed hyperlipidemia Low fat diet - Lipid panel  3. Hypothyroidism, unspecified type Labs pending - levothyroxine (SYNTHROID) 88 MCG tablet; Take 1 tablet (88 mcg total) by mouth daily.  Dispense: 90 tablet; Refill: 1 - Thyroid Panel With TSH  4. Recurrent major depressive disorder, in full remission ([place]) Stress management - citalopram (CELEXA) 20 MG tablet; TAKE ONE (1) TABLET EACH DAY  Dispense: 90 tablet; Refill: 1  5. GAD (generalized anxiety disorder) - clonazePAM (KLONOPIN) 0.5 MG tablet; Take 1 tablet (0.5 mg total) by mouth 2 (two) times daily.  Dispense: 60 tablet; Refill: 2  6. Current smoker Smoking cessation encouraged  7. Neuropathy Do no go barefooted - gabapentin (NEURONTIN) 100 MG capsule; TAKE ONE (1) CAPSULE THREE (3) TIMES EACH DAY  Dispense: 270 capsule; Refill: 1  8. BMI  26.0-26.9,adult Discussed diet and exercise for person with BMI >25 Will recheck weight in 3-6 months     Labs pending Health Maintenance reviewed Diet and exercise encouraged  Follow up plan: 6 months   Mary-Margaret Hassell Done, FNP

## 2020-01-06 NOTE — Patient Instructions (Signed)

## 2020-01-07 LAB — CBC WITH DIFFERENTIAL/PLATELET
Basophils Absolute: 0.1 10*3/uL (ref 0.0–0.2)
Basos: 1 %
EOS (ABSOLUTE): 0.1 10*3/uL (ref 0.0–0.4)
Eos: 2 %
Hematocrit: 43.9 % (ref 34.0–46.6)
Hemoglobin: 14.8 g/dL (ref 11.1–15.9)
Immature Grans (Abs): 0 10*3/uL (ref 0.0–0.1)
Immature Granulocytes: 0 %
Lymphocytes Absolute: 2.4 10*3/uL (ref 0.7–3.1)
Lymphs: 27 %
MCH: 31.2 pg (ref 26.6–33.0)
MCHC: 33.7 g/dL (ref 31.5–35.7)
MCV: 93 fL (ref 79–97)
Monocytes Absolute: 0.8 10*3/uL (ref 0.1–0.9)
Monocytes: 9 %
Neutrophils Absolute: 5.5 10*3/uL (ref 1.4–7.0)
Neutrophils: 61 %
Platelets: 246 10*3/uL (ref 150–450)
RBC: 4.74 x10E6/uL (ref 3.77–5.28)
RDW: 12.1 % (ref 11.7–15.4)
WBC: 9 10*3/uL (ref 3.4–10.8)

## 2020-01-07 LAB — CMP14+EGFR
ALT: 8 IU/L (ref 0–32)
AST: 16 IU/L (ref 0–40)
Albumin/Globulin Ratio: 1.7 (ref 1.2–2.2)
Albumin: 4.4 g/dL (ref 3.6–4.6)
Alkaline Phosphatase: 107 IU/L (ref 44–121)
BUN/Creatinine Ratio: 15 (ref 12–28)
BUN: 20 mg/dL (ref 8–27)
Bilirubin Total: 0.5 mg/dL (ref 0.0–1.2)
CO2: 28 mmol/L (ref 20–29)
Calcium: 9.4 mg/dL (ref 8.7–10.3)
Chloride: 98 mmol/L (ref 96–106)
Creatinine, Ser: 1.37 mg/dL — ABNORMAL HIGH (ref 0.57–1.00)
GFR calc Af Amer: 42 mL/min/{1.73_m2} — ABNORMAL LOW (ref >59)
GFR calc non Af Amer: 36 mL/min/{1.73_m2} — ABNORMAL LOW (ref >59)
Globulin, Total: 2.6 g/dL (ref 1.5–4.5)
Glucose: 95 mg/dL (ref 65–99)
Potassium: 4.1 mmol/L (ref 3.5–5.2)
Sodium: 138 mmol/L (ref 134–144)
Total Protein: 7 g/dL (ref 6.0–8.5)

## 2020-01-07 LAB — LIPID PANEL
Chol/HDL Ratio: 3.8 ratio (ref 0.0–4.4)
Cholesterol, Total: 158 mg/dL (ref 100–199)
HDL: 42 mg/dL (ref >39)
LDL Chol Calc (NIH): 94 mg/dL (ref 0–99)
Triglycerides: 121 mg/dL (ref 0–149)
VLDL Cholesterol Cal: 22 mg/dL (ref 5–40)

## 2020-01-07 LAB — THYROID PANEL WITH TSH
Free Thyroxine Index: 2.2 (ref 1.2–4.9)
T3 Uptake Ratio: 27 % (ref 24–39)
T4, Total: 8.1 ug/dL (ref 4.5–12.0)
TSH: 0.491 u[IU]/mL (ref 0.450–4.500)

## 2020-01-10 ENCOUNTER — Other Ambulatory Visit: Payer: Self-pay

## 2020-01-10 ENCOUNTER — Ambulatory Visit (INDEPENDENT_AMBULATORY_CARE_PROVIDER_SITE_OTHER): Payer: Medicare HMO

## 2020-01-10 DIAGNOSIS — E538 Deficiency of other specified B group vitamins: Secondary | ICD-10-CM

## 2020-01-10 NOTE — Progress Notes (Signed)
B12 given and tolerated well °

## 2020-01-15 IMAGING — DX DG LUMBAR SPINE 2-3V
2 series · 2 of 2 positions shown · non-contrast
Comparison: None.

CLINICAL DATA: Bilateral foot numbness

EXAM:
LUMBAR SPINE - 2-3 VIEW

[l-spine ap]
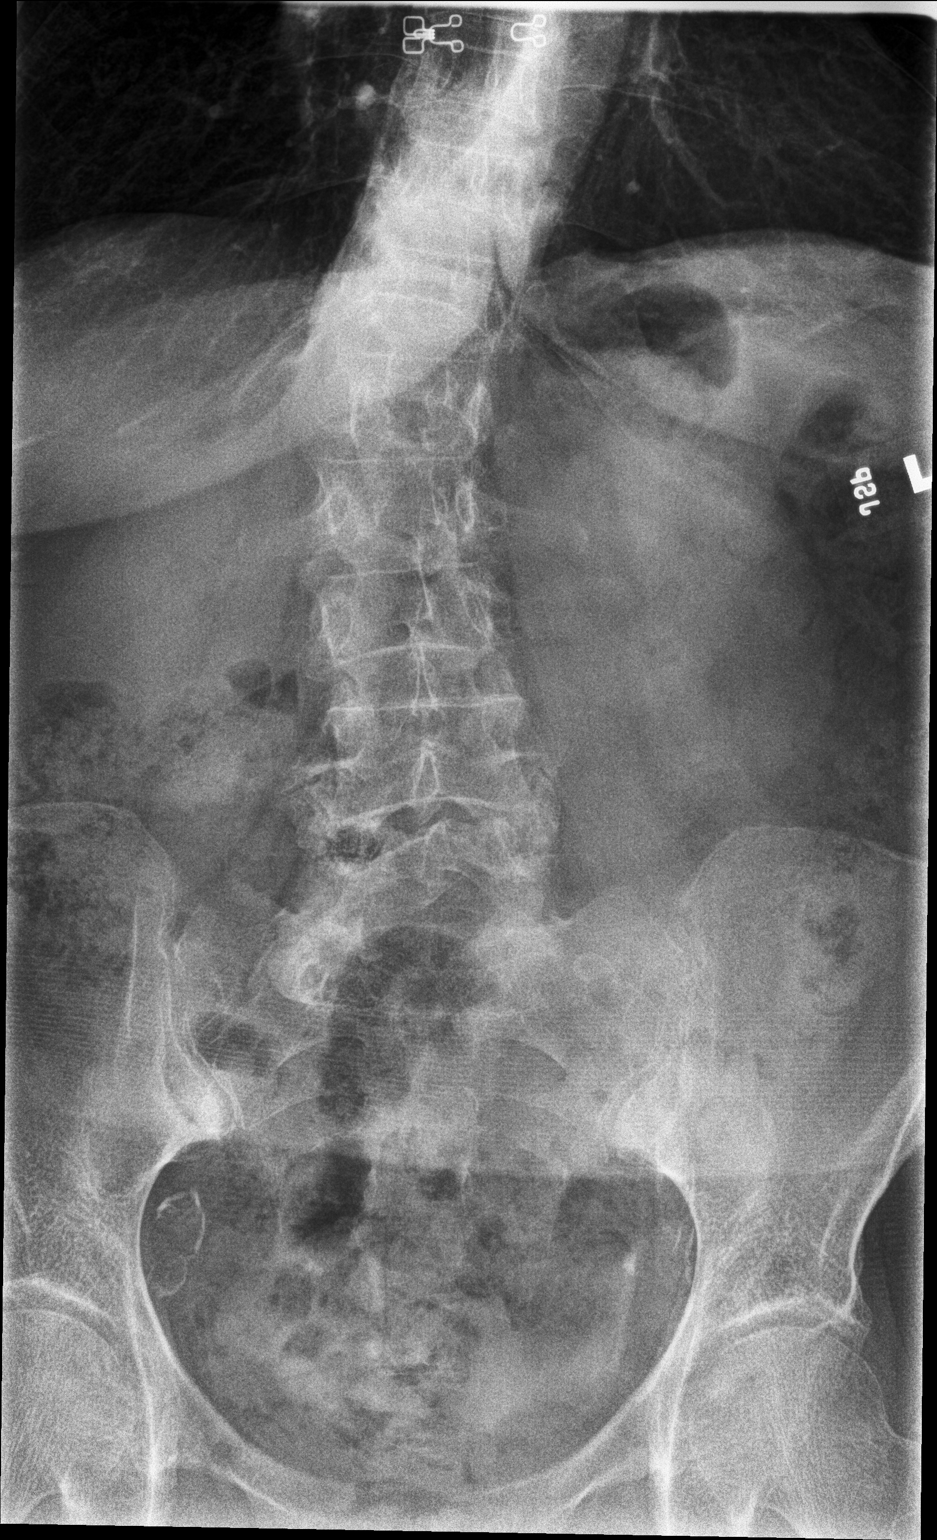

[l-spine lat]
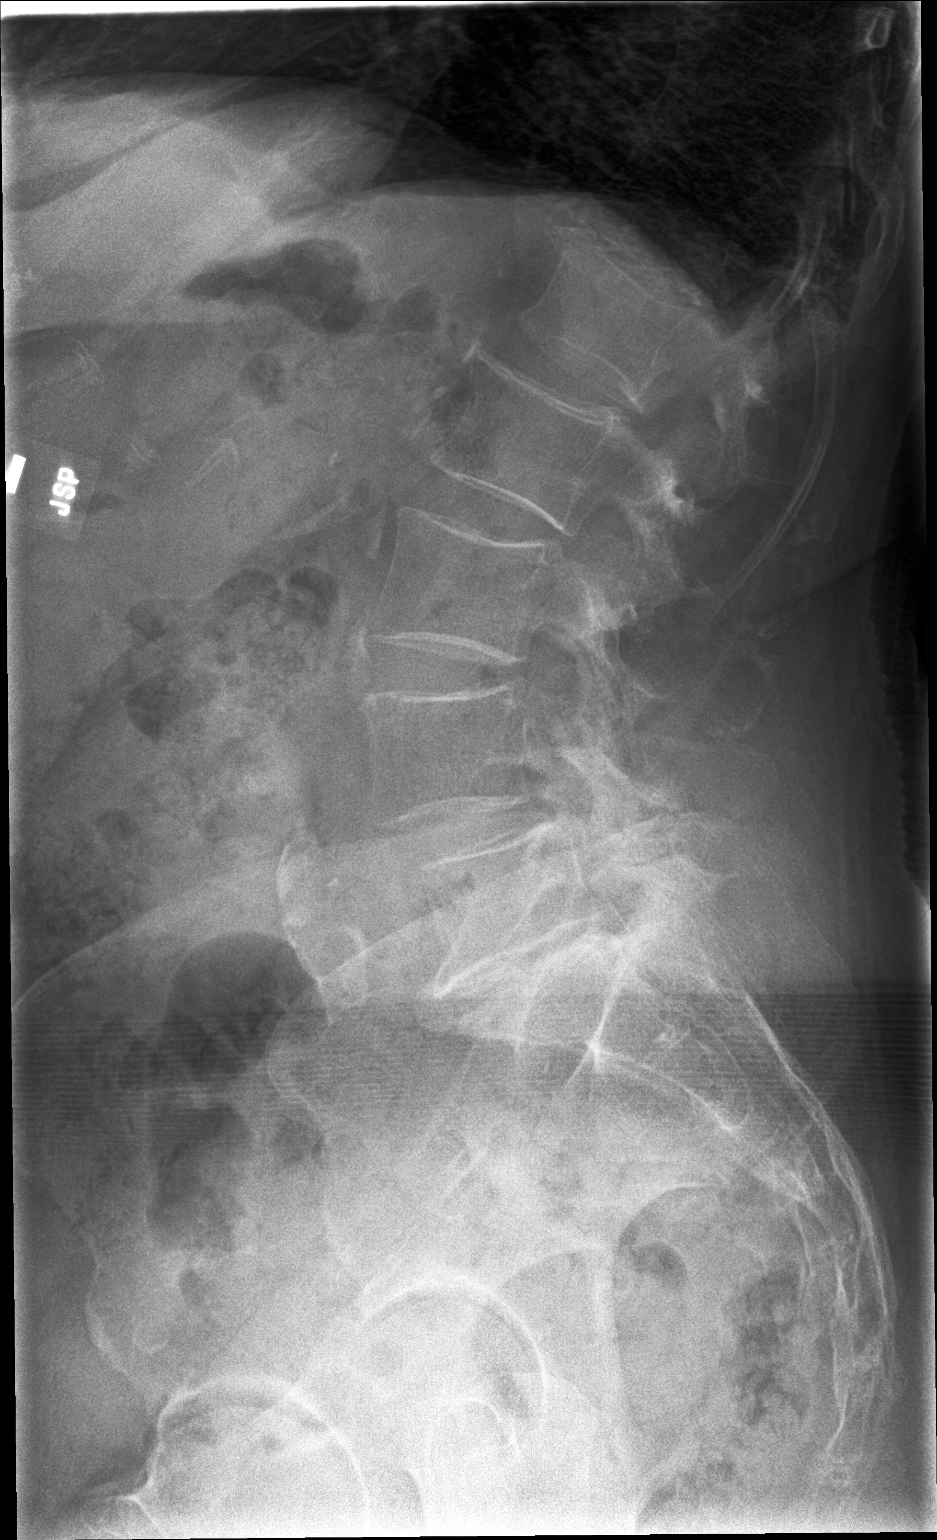

[2 of 2 positions shown; findings below may reference images not displayed]

FINDINGS: The lumbar vertebrae are in normal alignment with perhaps slightly
exaggerated lordosis. There is degenerative disc disease at L5-S1
where there is loss of disc space, sclerosis, and spurring present.
No acute compression deformity is seen. Mild curvature of the lumbar
spine convex to the right is noted. Moderate abdominal aortic
atherosclerosis is present. The SI joints appear corticated.
IMPRESSION: 1. Normal alignment with degenerative disc disease at L5-S1.
2. Mild curvature of the lumbar spine convex to the right.

## 2020-01-24 DIAGNOSIS — S42212D Unspecified displaced fracture of surgical neck of left humerus, subsequent encounter for fracture with routine healing: Secondary | ICD-10-CM | POA: Diagnosis not present

## 2020-02-11 ENCOUNTER — Other Ambulatory Visit: Payer: Self-pay

## 2020-02-11 ENCOUNTER — Ambulatory Visit (INDEPENDENT_AMBULATORY_CARE_PROVIDER_SITE_OTHER): Payer: Medicare HMO | Admitting: *Deleted

## 2020-02-11 DIAGNOSIS — E538 Deficiency of other specified B group vitamins: Secondary | ICD-10-CM | POA: Diagnosis not present

## 2020-02-11 NOTE — Progress Notes (Signed)
Patient in today for monthly B 12 injection. 1000 mcg given IM in left deltoid. Patient tolerated well.  

## 2020-02-24 DIAGNOSIS — Z09 Encounter for follow-up examination after completed treatment for conditions other than malignant neoplasm: Secondary | ICD-10-CM | POA: Diagnosis not present

## 2020-02-24 DIAGNOSIS — S42212D Unspecified displaced fracture of surgical neck of left humerus, subsequent encounter for fracture with routine healing: Secondary | ICD-10-CM | POA: Diagnosis not present

## 2020-03-13 ENCOUNTER — Ambulatory Visit (INDEPENDENT_AMBULATORY_CARE_PROVIDER_SITE_OTHER): Payer: Medicare HMO | Admitting: *Deleted

## 2020-03-13 ENCOUNTER — Other Ambulatory Visit: Payer: Self-pay

## 2020-03-13 DIAGNOSIS — E538 Deficiency of other specified B group vitamins: Secondary | ICD-10-CM

## 2020-03-27 ENCOUNTER — Other Ambulatory Visit: Payer: Self-pay | Admitting: Nurse Practitioner

## 2020-03-27 DIAGNOSIS — F411 Generalized anxiety disorder: Secondary | ICD-10-CM

## 2020-03-27 DIAGNOSIS — I10 Essential (primary) hypertension: Secondary | ICD-10-CM

## 2020-04-14 ENCOUNTER — Ambulatory Visit: Payer: Medicare HMO

## 2020-04-17 ENCOUNTER — Ambulatory Visit (INDEPENDENT_AMBULATORY_CARE_PROVIDER_SITE_OTHER): Payer: Medicare HMO | Admitting: Family Medicine

## 2020-04-17 ENCOUNTER — Other Ambulatory Visit: Payer: Self-pay

## 2020-04-17 DIAGNOSIS — E538 Deficiency of other specified B group vitamins: Secondary | ICD-10-CM | POA: Diagnosis not present

## 2020-04-20 ENCOUNTER — Ambulatory Visit (INDEPENDENT_AMBULATORY_CARE_PROVIDER_SITE_OTHER): Payer: Medicare HMO

## 2020-04-20 DIAGNOSIS — Z0000 Encounter for general adult medical examination without abnormal findings: Secondary | ICD-10-CM

## 2020-04-20 NOTE — Progress Notes (Signed)
MEDICARE ANNUAL WELLNESS VISIT  04/20/2020  Telephone Visit Disclaimer This Medicare AWV was conducted by telephone due to national recommendations for restrictions regarding the COVID-19 Pandemic (e.g. social distancing).  I verified, using two identifiers, that I am speaking with Denise Stewart or their authorized healthcare agent. I discussed the limitations, risks, security, and privacy concerns of performing an evaluation and management service by telephone and the potential availability of an in-person appointment in the future. The patient expressed understanding and agreed to proceed.  Location of Patient: Home Location of Provider (nurse):  [facility]  Subjective:    Denise Stewart is a 80 y.o. female patient of Bennie Pierini, FNP who had a Medicare Annual Wellness Visit today via telephone. Denise Stewart is Retired and lives alone. she has two children and two grandchildren. she reports that she is socially active and does interact with friends/family regularly. she is minimally physically active and enjoys working in her flowers, working puzzles and Clinical biochemist..  Patient Care Team: Bennie Pierini, FNP as PCP - General (Nurse Practitioner)  Advanced Directives 04/20/2020 04/20/2019 05/13/2017 03/18/2014  Does Patient Have a Medical Advance Directive? Yes Yes No No  Type of Estate agent of [employer] Power of [place];Living will - -  Does patient want to make changes to medical advance directive? No - Patient declined No - Patient declined - -  Copy of Healthcare Power of Attorney in Chart? No - copy requested No - copy requested - -  Would patient like information on creating a medical advance directive? - - Yes (MAU/Ambulatory/Procedural Areas - Information given) Yes - Educational materials given    Hospital Utilization Over the Past 12 Months: # of hospitalizations or ER visits: 0 # of surgeries: 0  Review of Systems    Patient reports  that her overall health is unchanged compared to last year.  History obtained from chart review and the patient  Patient Reported Readings (BP, Pulse, CBG, Weight, etc) none  Pain Assessment Pain : No/denies pain     Current Medications & Allergies (verified) Allergies as of 04/20/2020      Reactions   Celexa [citalopram Hydrobromide] Anaphylaxis, Shortness Of Breath, Swelling   Zocor [simvastatin]    myalsia   Pravachol [pravastatin Sodium] Rash      Medication List       Accurate as of April 20, 2020  1:26 PM. If you have any questions, ask your nurse or doctor.        amLODipine 5 MG tablet Commonly known as: NORVASC TAKE ONE (1) TABLET EACH DAY   aspirin 81 MG tablet Take 81 mg by mouth daily.   calcium carbonate 600 MG Tabs tablet Commonly known as: OS-CAL Take 600 mg by mouth 2 (two) times daily with a meal.   citalopram 20 MG tablet Commonly known as: CELEXA TAKE ONE (1) TABLET EACH DAY   clonazePAM 0.5 MG tablet Commonly known as: KLONOPIN TAKE ONE TABLET BY MOUTH TWICE DAILY   fish oil-omega-3 fatty acids 1000 MG capsule Take 2 g by mouth daily.   gabapentin 100 MG capsule Commonly known as: NEURONTIN TAKE ONE (1) CAPSULE THREE (3) TIMES EACH DAY   hydrochlorothiazide 25 MG tablet Commonly known as: HYDRODIURIL TAKE ONE (1) TABLET EACH DAY   levothyroxine 88 MCG tablet Commonly known as: SYNTHROID Take 1 tablet (88 mcg total) by mouth daily.   lisinopril 40 MG tablet Commonly known as: ZESTRIL TAKE ONE (1) TABLET EACH DAY   metoprolol  succinate 100 MG 24 hr tablet Commonly known as: TOPROL-XL Take 1 tablet (100 mg total) by mouth daily. with food   Vitamin D-3 25 MCG (1000 UT) Caps Take 1,000 Units by mouth daily.       History (reviewed): Past Medical History:  Diagnosis Date  . Depression   . GAD (generalized anxiety disorder)   . Hyperlipidemia   . Hypertension   . Thyroid disease    Past Surgical History:  Procedure  Laterality Date  . EYE SURGERY Right 1970   right -UNDER eye lesion removed    Family History  Problem Relation Age of Onset  . Hypertension Mother   . Heart attack Mother 33  . Heart disease Mother   . Clotting disorder Father 100  . Stroke Brother   . Suicidality Sister   . Other Sister        train accident  . Cancer Sister        liver cancer  . Diabetes Brother   . Other Brother        storm accident  . Diabetes Brother        leg removed   . Cancer Brother        lung  . Cancer Brother        lymphoma   Social History   Socioeconomic History  . Marital status: Widowed    Spouse name: Not on file  . Number of children: 2  . Years of education: 10  . Highest education level: 10th grade  Occupational History  . Occupation: retired    Associate Professor: Patent examiner  Tobacco Use  . Smoking status: Current Every Day Smoker    Packs/day: 0.50    Years: 64.00    Pack years: 32.00    Types: Cigarettes    Start date: 04/08/1953  . Smokeless tobacco: Never Used  Vaping Use  . Vaping Use: Never used  Substance and Sexual Activity  . Alcohol use: No  . Drug use: No  . Sexual activity: Not Currently    Birth control/protection: Post-menopausal  Other Topics Concern  . Not on file  Social History Narrative  . Not on file   Social Determinants of Health   Financial Resource Strain: Not on file  Food Insecurity: Not on file  Transportation Needs: Not on file  Physical Activity: Not on file  Stress: Not on file  Social Connections: Not on file    Activities of Daily Living In your present state of health, do you have any difficulty performing the following activities: 04/20/2020  Hearing? N  Vision? N  Difficulty concentrating or making decisions? N  Walking or climbing stairs? N  Dressing or bathing? N  Doing errands, shopping? Y  Comment daughter takes her most places, patient does still drive sometimes  Quarry manager and eating ? N  Using the Toilet? N   In the past six months, have you accidently leaked urine? N  Do you have problems with loss of bowel control? N  Managing your Medications? N  Managing your Finances? N  Housekeeping or managing your Housekeeping? N  Some recent data might be hidden    Patient Education/ Literacy What is the last grade level you completed in school?: 9th grade  Exercise Current Exercise Habits: The patient does not participate in regular exercise at present, Exercise limited by: None identified  Diet Patient reports consuming 2 meals a day and 2 snack(s) a day Patient reports that her primary diet is:  Regular Patient reports that she does have regular access to food.   Depression Screen PHQ 2/9 Scores 04/20/2020 01/06/2020 04/29/2019 04/20/2019 10/21/2018 02/27/2018 02/03/2018  PHQ - 2 Score 0 0 0 1 0 2 1  PHQ- 9 Score - - - - - 4 -     Fall Risk Fall Risk  04/20/2020 01/06/2020 04/29/2019 04/20/2019 10/21/2018  Falls in the past year? 1 1 0 1 1  Number falls in past yr: 0 0 - 0 0  Injury with Fall? 1 1 - 1 0  Comment fracture ribs and shoulder - - - -  Risk for fall due to : History of fall(s) History of fall(s);Impaired balance/gait - - -  Follow up Falls evaluation completed Falls evaluation completed - - -     Objective:  Leanda Padmore seemed alert and oriented and she participated appropriately during our telephone visit.  Blood Pressure Weight BMI  BP Readings from Last 3 Encounters:  01/06/20 (!) 145/85  04/29/19 (!) 143/82  10/21/18 133/76   Wt Readings from Last 3 Encounters:  01/06/20 150 lb 9.6 oz (68.3 kg)  04/29/19 151 lb (68.5 kg)  10/21/18 145 lb (65.8 kg)   BMI Readings from Last 1 Encounters:  01/06/20 26.68 kg/m    *Unable to obtain current vital signs, weight, and BMI due to telephone visit type  Hearing/Vision  . Yesena did not seem to have difficulty with hearing/understanding during the telephone conversation . Reports that she has not had a formal eye exam by an  eye care professional within the past year . Reports that she has not had a formal hearing evaluation within the past year *Unable to fully assess hearing and vision during telephone visit type  Cognitive Function: 6CIT Screen 04/20/2020 04/20/2019  What Year? 0 points 0 points  What month? 0 points 0 points  What time? 0 points 0 points  Count back from 20 0 points 0 points  Months in reverse 0 points 0 points  Repeat phrase 0 points 0 points  Total Score 0 0   (Normal:0-7, Significant for Dysfunction: >8)  Normal Cognitive Function Screening: Yes   Immunization & Health Maintenance Record Immunization History  Administered Date(s) Administered  . Pneumococcal Conjugate-13 08/29/2014  . Pneumococcal Polysaccharide-23 10/20/2015    Health Maintenance  Topic Date Due  . COVID-19 Vaccine (1) Never done  . TETANUS/TDAP  04/28/2020 (Originally 09/19/1959)  . INFLUENZA VACCINE  07/06/2020 (Originally 11/07/2019)  . MAMMOGRAM  01/05/2021 (Originally 09/19/1958)  . DEXA SCAN  Completed  . PNA vac Low Risk Adult  Completed       Assessment  This is a routine wellness examination for Priya Matsen.  Health Maintenance: Due or Overdue Health Maintenance Due  Topic Date Due  . COVID-19 Vaccine (1) Never done    Denise Stewart does not need a referral for Community Assistance: Care Management:   no Social Work:    no Prescription Assistance:  no Nutrition/Diabetes Education:  no   Plan:  Personalized Goals Goals Addressed            This Visit's Progress   . Patient Stated       04/20/2020 AWV Goal: Exercise for General Health   Patient will verbalize understanding of the benefits of increased physical activity:  Exercising regularly is important. It will improve your overall fitness, flexibility, and endurance.  Regular exercise also will improve your overall health. It can help you control your weight, reduce stress, and improve your bone  density.  Over the next  year, patient will increase physical activity as tolerated with a goal of at least 150 minutes of moderate physical activity per week.   You can tell that you are exercising at a moderate intensity if your heart starts beating faster and you start breathing faster but can still hold a conversation.  Moderate-intensity exercise ideas include:  Walking 1 mile (1.6 km) in about 15 minutes  Biking  Hiking  Golfing  Dancing  Water aerobics  Patient will verbalize understanding of everyday activities that increase physical activity by providing examples like the following: ? Yard work, such as: ? Pushing a Surveyor, mininglawn mower ? Raking and bagging leaves ? Washing your car ? Pushing a stroller ? Shoveling snow ? Gardening ? Washing windows or floors  Patient will be able to explain general safety guidelines for exercising:   Before you start a new exercise program, talk with your health care provider.  Do not exercise so much that you hurt yourself, feel dizzy, or get very short of breath.  Wear comfortable clothes and wear shoes with good support.  Drink plenty of water while you exercise to prevent dehydration or heat stroke.  Work out until your breathing and your heartbeat get faster.       Personalized Health Maintenance & Screening Recommendations  Influenza vaccine Td vaccine Bone densitometry screening  Lung Cancer Screening Recommended: yes (Low Dose CT Chest recommended if Age 46-80 years, 30 pack-year currently smoking OR have quit w/in past 15 years) Hepatitis C Screening recommended: no HIV Screening recommended: no  Advanced Directives: Written information was not prepared per patient's request.  Referrals & Orders No orders of the defined types were placed in this encounter.   Follow-up Plan . Follow-up with Bennie PieriniMartin, Mary-Margaret, FNP as planned . Schedule dexa scan . Schedule Tdap vaccine   I have personally reviewed and noted the following in the  patient's chart:   . Medical and social history . Use of alcohol, tobacco or illicit drugs  . Current medications and supplements . Functional ability and status . Nutritional status . Physical activity . Advanced directives . List of other physicians . Hospitalizations, surgeries, and ER visits in previous 12 months . Vitals . Screenings to include cognitive, depression, and falls . Referrals and appointments  In addition, I have reviewed and discussed with Denise DrapeAnnie Dever certain preventive protocols, quality metrics, and best practice recommendations. A written personalized care plan for preventive services as well as general preventive health recommendations is available and can be mailed to the patient at her request.      Mariam DollarJan Ed Rayson, LPN    [id number]/34741/13/2022   AVS summary declined by patient.

## 2020-04-26 ENCOUNTER — Other Ambulatory Visit: Payer: Self-pay | Admitting: Nurse Practitioner

## 2020-04-26 DIAGNOSIS — I10 Essential (primary) hypertension: Secondary | ICD-10-CM

## 2020-05-05 ENCOUNTER — Other Ambulatory Visit: Payer: Self-pay | Admitting: Nurse Practitioner

## 2020-05-05 DIAGNOSIS — I10 Essential (primary) hypertension: Secondary | ICD-10-CM

## 2020-05-18 ENCOUNTER — Other Ambulatory Visit: Payer: Self-pay

## 2020-05-18 ENCOUNTER — Ambulatory Visit (INDEPENDENT_AMBULATORY_CARE_PROVIDER_SITE_OTHER): Payer: Medicare HMO | Admitting: *Deleted

## 2020-05-18 DIAGNOSIS — E538 Deficiency of other specified B group vitamins: Secondary | ICD-10-CM

## 2020-06-19 ENCOUNTER — Other Ambulatory Visit: Payer: Self-pay

## 2020-06-19 ENCOUNTER — Ambulatory Visit (INDEPENDENT_AMBULATORY_CARE_PROVIDER_SITE_OTHER): Payer: Medicare HMO | Admitting: *Deleted

## 2020-06-19 DIAGNOSIS — E538 Deficiency of other specified B group vitamins: Secondary | ICD-10-CM | POA: Diagnosis not present

## 2020-06-19 NOTE — Progress Notes (Signed)
Pt given B12 injection IM left deltoid and tolerated well. °

## 2020-07-04 ENCOUNTER — Ambulatory Visit (INDEPENDENT_AMBULATORY_CARE_PROVIDER_SITE_OTHER): Payer: Medicare HMO | Admitting: Nurse Practitioner

## 2020-07-04 ENCOUNTER — Encounter: Payer: Self-pay | Admitting: Nurse Practitioner

## 2020-07-04 ENCOUNTER — Other Ambulatory Visit: Payer: Self-pay

## 2020-07-04 VITALS — BP 150/79 | HR 58 | Temp 97.3°F | Resp 20 | Ht 63.0 in | Wt 150.0 lb

## 2020-07-04 DIAGNOSIS — G629 Polyneuropathy, unspecified: Secondary | ICD-10-CM | POA: Diagnosis not present

## 2020-07-04 DIAGNOSIS — I10 Essential (primary) hypertension: Secondary | ICD-10-CM | POA: Diagnosis not present

## 2020-07-04 DIAGNOSIS — E039 Hypothyroidism, unspecified: Secondary | ICD-10-CM

## 2020-07-04 DIAGNOSIS — F3342 Major depressive disorder, recurrent, in full remission: Secondary | ICD-10-CM | POA: Diagnosis not present

## 2020-07-04 DIAGNOSIS — F17200 Nicotine dependence, unspecified, uncomplicated: Secondary | ICD-10-CM | POA: Diagnosis not present

## 2020-07-04 DIAGNOSIS — F411 Generalized anxiety disorder: Secondary | ICD-10-CM | POA: Diagnosis not present

## 2020-07-04 DIAGNOSIS — Z6827 Body mass index (BMI) 27.0-27.9, adult: Secondary | ICD-10-CM

## 2020-07-04 DIAGNOSIS — E782 Mixed hyperlipidemia: Secondary | ICD-10-CM | POA: Diagnosis not present

## 2020-07-04 MED ORDER — CLONAZEPAM 0.5 MG PO TABS: 0.5000 mg | ORAL_TABLET | Freq: Two times a day (BID) | ORAL | 5 refills | Status: DC

## 2020-07-04 MED ORDER — METOPROLOL SUCCINATE ER 100 MG PO TB24: 100.0000 mg | ORAL_TABLET | Freq: Every day | ORAL | 1 refills | Status: DC

## 2020-07-04 MED ORDER — GABAPENTIN 100 MG PO CAPS: ORAL_CAPSULE | ORAL | 1 refills | Status: DC

## 2020-07-04 MED ORDER — LEVOTHYROXINE SODIUM 88 MCG PO TABS: 88.0000 ug | ORAL_TABLET | Freq: Every day | ORAL | 1 refills | Status: DC

## 2020-07-04 MED ORDER — CITALOPRAM HYDROBROMIDE 20 MG PO TABS: ORAL_TABLET | ORAL | 1 refills | Status: DC

## 2020-07-04 MED ORDER — HYDROCHLOROTHIAZIDE 25 MG PO TABS
25.0000 mg | ORAL_TABLET | Freq: Every day | ORAL | 1 refills | Status: DC
Start: 2020-07-04 — End: 2021-01-05

## 2020-07-04 MED ORDER — LISINOPRIL 40 MG PO TABS
ORAL_TABLET | ORAL | 1 refills | Status: DC
Start: 2020-07-04 — End: 2020-10-10

## 2020-07-04 MED ORDER — AMLODIPINE BESYLATE 5 MG PO TABS
5.0000 mg | ORAL_TABLET | Freq: Every day | ORAL | 1 refills | Status: DC
Start: 2020-07-04 — End: 2021-01-05

## 2020-07-04 NOTE — Patient Instructions (Signed)
Steps to Quit Smoking Smoking tobacco is the leading cause of preventable death. It can affect almost every organ in the body. Smoking puts you and people around you at risk for many serious, long-lasting (chronic) diseases. Quitting smoking can be hard, but it is one of the best things that you can do for your health. It is never too late to quit. How do I get ready to quit? When you decide to quit smoking, make a plan to help you succeed. Before you quit:  Pick a date to quit. Set a date within the next 2 weeks to give you time to prepare.  Write down the reasons why you are quitting. Keep this list in places where you will see it often.  Tell your family, friends, and co-workers that you are quitting. Their support is important.  Talk with your doctor about the choices that may help you quit.  Find out if your health insurance will pay for these treatments.  Know the people, places, things, and activities that make you want to smoke (triggers). Avoid them. What first steps can I take to quit smoking?  Throw away all cigarettes at home, at work, and in your car.  Throw away the things that you use when you smoke, such as ashtrays and lighters.  Clean your car. Make sure to empty the ashtray.  Clean your home, including curtains and carpets. What can I do to help me quit smoking? Talk with your doctor about taking medicines and seeing a counselor at the same time. You are more likely to succeed when you do both.  If you are pregnant or breastfeeding, talk with your doctor about counseling or other ways to quit smoking. Do not take medicine to help you quit smoking unless your doctor tells you to do so. To quit smoking: Quit right away  Quit smoking totally, instead of slowly cutting back on how much you smoke over a period of time.  Go to counseling. You are more likely to quit if you go to counseling sessions regularly. Take medicine You may take medicines to help you quit. Some  medicines need a prescription, and some you can buy over-the-counter. Some medicines may contain a drug called nicotine to replace the nicotine in cigarettes. Medicines may:  Help you to stop having the desire to smoke (cravings).  Help to stop the problems that come when you stop smoking (withdrawal symptoms). Your doctor may ask you to use:  Nicotine patches, gum, or lozenges.  Nicotine inhalers or sprays.  Non-nicotine medicine that is taken by mouth. Find resources Find resources and other ways to help you quit smoking and remain smoke-free after you quit. These resources are most helpful when you use them often. They include:  Online chats with a counselor.  Phone quitlines.  Printed self-help materials.  Support groups or group counseling.  Text messaging programs.  Mobile phone apps. Use apps on your mobile phone or tablet that can help you stick to your quit plan. There are many free apps for mobile phones and tablets as well as websites. Examples include Quit Guide from the CDC and smokefree.gov   What things can I do to make it easier to quit?  Talk to your family and friends. Ask them to support and encourage you.  Call a phone quitline (1-800-QUIT-NOW), reach out to support groups, or work with a counselor.  Ask people who smoke to not smoke around you.  Avoid places that make you want to smoke,   such as: ? Bars. ? Parties. ? Smoke-break areas at work.  Spend time with people who do not smoke.  Lower the stress in your life. Stress can make you want to smoke. Try these things to help your stress: ? Getting regular exercise. ? Doing deep-breathing exercises. ? Doing yoga. ? Meditating. ? Doing a body scan. To do this, close your eyes, focus on one area of your body at a time from head to toe. Notice which parts of your body are tense. Try to relax the muscles in those areas.   How will I feel when I quit smoking? Day 1 to 3 weeks Within the first 24 hours,  you may start to have some problems that come from quitting tobacco. These problems are very bad 2-3 days after you quit, but they do not often last for more than 2-3 weeks. You may get these symptoms:  Mood swings.  Feeling restless, nervous, angry, or annoyed.  Trouble concentrating.  Dizziness.  Strong desire for high-sugar foods and nicotine.  Weight gain.  Trouble pooping (constipation).  Feeling like you may vomit (nausea).  Coughing or a sore throat.  Changes in how the medicines that you take for other issues work in your body.  Depression.  Trouble sleeping (insomnia). Week 3 and afterward After the first 2-3 weeks of quitting, you may start to notice more positive results, such as:  Better sense of smell and taste.  Less coughing and sore throat.  Slower heart rate.  Lower blood pressure.  Clearer skin.  Better breathing.  Fewer sick days. Quitting smoking can be hard. Do not give up if you fail the first time. Some people need to try a few times before they succeed. Do your best to stick to your quit plan, and talk with your doctor if you have any questions or concerns. Summary  Smoking tobacco is the leading cause of preventable death. Quitting smoking can be hard, but it is one of the best things that you can do for your health.  When you decide to quit smoking, make a plan to help you succeed.  Quit smoking right away, not slowly over a period of time.  When you start quitting, seek help from your doctor, family, or friends. This information is not intended to replace advice given to you by your health care provider. Make sure you discuss any questions you have with your health care provider. Document Revised: 12/18/2018 Document Reviewed: 06/13/2018 Elsevier Patient Education  2021 Elsevier Inc.  

## 2020-07-04 NOTE — Progress Notes (Signed)
Subjective:    Patient ID: Denise Stewart, female    DOB: January 29, 1941, 80 y.o.   MRN: [id number]   Chief Complaint: medical management of chronic issues     HPI:  1. Primary hypertension No c/o chest pain, sob or headache. Doe snot check blood pressure at home. BP Readings from Last 3 Encounters:  07/04/20 (!) 150/79  01/06/20 (!) 145/85  04/29/19 (!) 143/82     2. Hypothyroidism, unspecified type No problems that she is aware of. Lab Results  Component Value Date   TSH 0.491 01/06/2020     3. Mixed hyperlipidemia Does ot watch diet and does very little exercise Lab Results  Component Value Date   CHOL 158 01/06/2020   HDL 42 01/06/2020   LDLCALC 94 01/06/2020   TRIG 121 01/06/2020   CHOLHDL 3.8 01/06/2020     4. Neuropathy Has some pain and burning in bil fett. Right being worse then left.sheis o gabat]entin which helps  5. GAD (generalized anxiety disorder) Is on klonopin daily and is doing well.  GAD 7 : Generalized Anxiety Score 07/04/2020 01/06/2020 04/29/2019 10/21/2018  Nervous, Anxious, on Edge 1 2 1 1   Control/stop worrying 1 2 1 1   Worry too much - different things 1 2 1 1   Trouble relaxing 0 - 0 1  Restless 0 0 0 0  Easily annoyed or irritable 0 0 0 0  Afraid - awful might happen 0 0 1 0  Total GAD 7 Score 3 - 4 4  Anxiety Difficulty Not difficult at all Somewhat difficult Not difficult at all Somewhat difficult      6. Recurrent major depressive disorder, in full remission ([place]) Is on celexa daily and is doing well. Depression screen [facility] 2/9 07/04/2020 04/20/2020 01/06/2020  Decreased Interest 0 0 0  Down, Depressed, Hopeless 0 0 0  PHQ - 2 Score 0 0 0  Altered sleeping 0 - -  Tired, decreased energy 2 - -  Change in appetite 0 - -  Feeling bad or failure about yourself  0 - -  Trouble concentrating 0 - -  Moving slowly or fidgety/restless 0 - -  Suicidal thoughts 0 - -  PHQ-9 Score 2 - -  Difficult doing work/chores Not difficult at all -  -  Some recent data might be hidden     7. Current smoker Smokes about 1/2pack a day  8. BMI 27.0-27.9,adult No recent weight changes Wt Readings from Last 3 Encounters:  07/04/20 150 lb (68 kg)  01/06/20 150 lb 9.6 oz (68.3 kg)  04/29/19 151 lb (68.5 kg)   BMI Readings from Last 3 Encounters:  07/04/20 26.57 kg/m  01/06/20 26.68 kg/m  04/29/19 26.75 kg/m       Outpatient Encounter Medications as of 07/04/2020  Medication Sig  . amLODipine (NORVASC) 5 MG tablet TAKE ONE (1) TABLET EACH DAY  . aspirin 81 MG tablet Take 81 mg by mouth daily.  . calcium carbonate (OS-CAL) 600 MG TABS Take 600 mg by mouth 2 (two) times daily with a meal.  . Cholecalciferol (VITAMIN D-3) 1000 UNITS CAPS Take 1,000 Units by mouth daily.  . citalopram (CELEXA) 20 MG tablet TAKE ONE (1) TABLET EACH DAY  . clonazePAM (KLONOPIN) 0.5 MG tablet TAKE ONE TABLET BY MOUTH TWICE DAILY  . fish oil-omega-3 fatty acids 1000 MG capsule Take 2 g by mouth daily.  Marland Kitchen gabapentin (NEURONTIN) 100 MG capsule TAKE ONE (1) CAPSULE THREE (3) TIMES EACH DAY  .  hydrochlorothiazide (HYDRODIURIL) 25 MG tablet TAKE ONE (1) TABLET EACH DAY  . levothyroxine (SYNTHROID) 88 MCG tablet Take 1 tablet (88 mcg total) by mouth daily.  Marland Kitchen lisinopril (ZESTRIL) 40 MG tablet TAKE ONE (1) TABLET EACH DAY  . metoprolol succinate (TOPROL-XL) 100 MG 24 hr tablet Take 1 tablet (100 mg total) by mouth daily. with food   Facility-Administered Encounter Medications as of 07/04/2020  Medication  . cyanocobalamin ((VITAMIN B-12)) injection 1,000 mcg    Past Surgical History:  Procedure Laterality Date  . EYE SURGERY Right 1970   right -UNDER eye lesion removed     Family History  Problem Relation Age of Onset  . Hypertension Mother   . Heart attack Mother 46  . Heart disease Mother   . Clotting disorder Father 100  . Stroke Brother   . Suicidality Sister   . Other Sister        train accident  . Cancer Sister        liver cancer   . Diabetes Brother   . Other Brother        storm accident  . Diabetes Brother        leg removed   . Cancer Brother        lung  . Cancer Brother        lymphoma    New complaints: None today  Social history: Lives by herself. Her daughter checks on her daily.  Controlled substance contract: 07/04/20    Review of Systems  Constitutional: Negative for diaphoresis.  Eyes: Negative for pain.  Respiratory: Negative for shortness of breath.   Cardiovascular: Negative for chest pain, palpitations and leg swelling.  Gastrointestinal: Negative for abdominal pain.  Endocrine: Negative for polydipsia.  Skin: Negative for rash.  Neurological: Negative for dizziness, weakness and headaches.  Hematological: Does not bruise/bleed easily.  All other systems reviewed and are negative.      Objective:   Physical Exam Vitals and nursing note reviewed.  Constitutional:      General: She is not in acute distress.    Appearance: Normal appearance. She is well-developed.  HENT:     Head: Normocephalic.     Nose: Nose normal.  Eyes:     Pupils: Pupils are equal, round, and reactive to light.  Neck:     Vascular: No carotid bruit or JVD.  Cardiovascular:     Rate and Rhythm: Normal rate and regular rhythm.     Heart sounds: Normal heart sounds.  Pulmonary:     Effort: Pulmonary effort is normal. No respiratory distress.     Breath sounds: Normal breath sounds. No wheezing or rales.  Chest:     Chest wall: No tenderness.  Abdominal:     General: Bowel sounds are normal. There is no distension or abdominal bruit.     Palpations: Abdomen is soft. There is no hepatomegaly, splenomegaly, mass or pulsatile mass.     Tenderness: There is no abdominal tenderness.  Musculoskeletal:        General: Normal range of motion.     Cervical back: Normal range of motion and neck supple.     Comments: Gait slow an dsteady  Lymphadenopathy:     Cervical: No cervical adenopathy.  Skin:     General: Skin is warm and dry.  Neurological:     Mental Status: She is alert and oriented to person, place, and time.     Deep Tendon Reflexes: Reflexes are normal and symmetric.  Psychiatric:        Behavior: Behavior normal.        Thought Content: Thought content normal.        Judgment: Judgment normal.    BP (!) 150/79   Pulse (!) 58   Temp (!) 97.3 F (36.3 C) (Temporal)   Resp 20   Ht 5' 3"  (1.6 m)   Wt 150 lb (68 kg)   SpO2 95%   BMI 26.57 kg/m         Assessment & Plan:  Hasna Stefanik comes in today with chief complaint of Medical Management of Chronic Issues   Diagnosis and orders addressed:  1. Primary hypertension Low sodium diet - amLODipine (NORVASC) 5 MG tablet; Take 1 tablet (5 mg total) by mouth daily.  Dispense: 90 tablet; Refill: 1 - hydrochlorothiazide (HYDRODIURIL) 25 MG tablet; Take 1 tablet (25 mg total) by mouth daily.  Dispense: 90 tablet; Refill: 1 - metoprolol succinate (TOPROL-XL) 100 MG 24 hr tablet; Take 1 tablet (100 mg total) by mouth daily. with food  Dispense: 90 tablet; Refill: 1 - lisinopril (ZESTRIL) 40 MG tablet; TAKE ONE (1) TABLET EACH DAY  Dispense: 90 tablet; Refill: 1  2. Hypothyroidism, unspecified type Labs pending - levothyroxine (SYNTHROID) 88 MCG tablet; Take 1 tablet (88 mcg total) by mouth daily.  Dispense: 90 tablet; Refill: 1  3. Mixed hyperlipidemia Low fat diet  4. Neuropathy Do not go barefooted - gabapentin (NEURONTIN) 100 MG capsule; TAKE ONE (1) CAPSULE THREE (3) TIMES EACH DAY  Dispense: 270 capsule; Refill: 1  5. GAD (generalized anxiety disorder) stress management - clonazePAM (KLONOPIN) 0.5 MG tablet; Take 1 tablet (0.5 mg total) by mouth 2 (two) times daily.  Dispense: 60 tablet; Refill: 5  6. Recurrent major depressive disorder, in full remission ([facility])  citalopram (CELEXA) 20 MG tablet; TAKE ONE (1) TABLET EACH DAY  Dispense: 90 tablet; Refill: 1  7. Current smoker Smoking cessation  encouraged  8. BMI 27.0-27.9,adult Discussed diet and exercise for person with BMI >25 Will recheck weight in 3-6 months   Orders Placed This Encounter  Procedures  . CBC with Differential/Platelet  . CMP14+EGFR  . Lipid panel  . Thyroid Panel With TSH     Labs pending Health Maintenance reviewed Diet and exercise encouraged  Follow up plan: 6 months   [place], FNP

## 2020-07-05 LAB — CMP14+EGFR
ALT: 8 IU/L (ref 0–32)
AST: 15 IU/L (ref 0–40)
Albumin/Globulin Ratio: 1.7 (ref 1.2–2.2)
Albumin: 4.3 g/dL (ref 3.6–4.6)
Alkaline Phosphatase: 96 IU/L (ref 44–121)
BUN/Creatinine Ratio: 13 (ref 12–28)
BUN: 16 mg/dL (ref 8–27)
Bilirubin Total: 0.5 mg/dL (ref 0.0–1.2)
CO2: 26 mmol/L (ref 20–29)
Calcium: 9.1 mg/dL (ref 8.7–10.3)
Chloride: 97 mmol/L (ref 96–106)
Creatinine, Ser: 1.2 mg/dL — ABNORMAL HIGH (ref 0.57–1.00)
Globulin, Total: 2.6 g/dL (ref 1.5–4.5)
Glucose: 90 mg/dL (ref 65–99)
Potassium: 3.9 mmol/L (ref 3.5–5.2)
Sodium: 138 mmol/L (ref 134–144)
Total Protein: 6.9 g/dL (ref 6.0–8.5)
eGFR: 45 mL/min/{1.73_m2} — ABNORMAL LOW (ref >59)

## 2020-07-05 LAB — LIPID PANEL
Chol/HDL Ratio: 3.8 ratio (ref 0.0–4.4)
Cholesterol, Total: 171 mg/dL (ref 100–199)
HDL: 45 mg/dL (ref >39)
LDL Chol Calc (NIH): 104 mg/dL — ABNORMAL HIGH (ref 0–99)
Triglycerides: 125 mg/dL (ref 0–149)
VLDL Cholesterol Cal: 22 mg/dL (ref 5–40)

## 2020-07-05 LAB — CBC WITH DIFFERENTIAL/PLATELET
Basophils Absolute: 0.1 10*3/uL (ref 0.0–0.2)
Basos: 1 %
EOS (ABSOLUTE): 0.1 10*3/uL (ref 0.0–0.4)
Eos: 1 %
Hematocrit: 43.7 % (ref 34.0–46.6)
Hemoglobin: 14.7 g/dL (ref 11.1–15.9)
Immature Grans (Abs): 0.1 10*3/uL (ref 0.0–0.1)
Immature Granulocytes: 1 %
Lymphocytes Absolute: 2.7 10*3/uL (ref 0.7–3.1)
Lymphs: 29 %
MCH: 31.7 pg (ref 26.6–33.0)
MCHC: 33.6 g/dL (ref 31.5–35.7)
MCV: 94 fL (ref 79–97)
Monocytes Absolute: 0.7 10*3/uL (ref 0.1–0.9)
Monocytes: 8 %
Neutrophils Absolute: 5.8 10*3/uL (ref 1.4–7.0)
Neutrophils: 60 %
Platelets: 221 10*3/uL (ref 150–450)
RBC: 4.64 x10E6/uL (ref 3.77–5.28)
RDW: 12.3 % (ref 11.7–15.4)
WBC: 9.5 10*3/uL (ref 3.4–10.8)

## 2020-07-05 LAB — THYROID PANEL WITH TSH
Free Thyroxine Index: 2.5 (ref 1.2–4.9)
T3 Uptake Ratio: 26 % (ref 24–39)
T4, Total: 9.8 ug/dL (ref 4.5–12.0)
TSH: 0.545 u[IU]/mL (ref 0.450–4.500)

## 2020-07-24 ENCOUNTER — Other Ambulatory Visit: Payer: Self-pay

## 2020-07-24 ENCOUNTER — Ambulatory Visit (INDEPENDENT_AMBULATORY_CARE_PROVIDER_SITE_OTHER): Payer: Medicare HMO

## 2020-07-24 DIAGNOSIS — E538 Deficiency of other specified B group vitamins: Secondary | ICD-10-CM

## 2020-07-24 NOTE — Progress Notes (Signed)
B12 injection given to patient and tolerated well.  

## 2020-08-24 ENCOUNTER — Ambulatory Visit (INDEPENDENT_AMBULATORY_CARE_PROVIDER_SITE_OTHER): Payer: Medicare HMO

## 2020-08-24 ENCOUNTER — Other Ambulatory Visit: Payer: Self-pay

## 2020-08-24 DIAGNOSIS — E538 Deficiency of other specified B group vitamins: Secondary | ICD-10-CM | POA: Diagnosis not present

## 2020-08-24 MED ORDER — CYANOCOBALAMIN 1000 MCG/ML IJ SOLN
1000.0000 ug | INTRAMUSCULAR | Status: AC
  Administered 2020-08-24 – 2021-03-05 (×7): 1000 ug via INTRAMUSCULAR

## 2020-08-24 NOTE — Progress Notes (Signed)
Cyanocobalamin injection given left deltoid.  Patient tolerated well.

## 2020-09-25 ENCOUNTER — Ambulatory Visit (INDEPENDENT_AMBULATORY_CARE_PROVIDER_SITE_OTHER): Payer: Medicare HMO

## 2020-09-25 ENCOUNTER — Other Ambulatory Visit: Payer: Self-pay

## 2020-09-25 DIAGNOSIS — E538 Deficiency of other specified B group vitamins: Secondary | ICD-10-CM

## 2020-09-25 NOTE — Progress Notes (Signed)
B12 injection given to patient and tolerated well.  

## 2020-10-09 ENCOUNTER — Other Ambulatory Visit: Payer: Self-pay | Admitting: Nurse Practitioner

## 2020-10-09 DIAGNOSIS — G629 Polyneuropathy, unspecified: Secondary | ICD-10-CM

## 2020-10-09 DIAGNOSIS — F3342 Major depressive disorder, recurrent, in full remission: Secondary | ICD-10-CM

## 2020-10-09 DIAGNOSIS — I10 Essential (primary) hypertension: Secondary | ICD-10-CM

## 2020-10-26 ENCOUNTER — Ambulatory Visit (INDEPENDENT_AMBULATORY_CARE_PROVIDER_SITE_OTHER): Payer: Medicare HMO | Admitting: *Deleted

## 2020-10-26 ENCOUNTER — Other Ambulatory Visit: Payer: Self-pay

## 2020-10-26 DIAGNOSIS — E538 Deficiency of other specified B group vitamins: Secondary | ICD-10-CM

## 2020-11-23 DIAGNOSIS — M722 Plantar fascial fibromatosis: Secondary | ICD-10-CM | POA: Diagnosis not present

## 2020-11-23 DIAGNOSIS — M79672 Pain in left foot: Secondary | ICD-10-CM | POA: Diagnosis not present

## 2020-11-27 ENCOUNTER — Other Ambulatory Visit: Payer: Self-pay

## 2020-11-27 ENCOUNTER — Ambulatory Visit (INDEPENDENT_AMBULATORY_CARE_PROVIDER_SITE_OTHER): Payer: Medicare HMO

## 2020-11-27 DIAGNOSIS — E538 Deficiency of other specified B group vitamins: Secondary | ICD-10-CM

## 2020-11-27 NOTE — Progress Notes (Signed)
Cyanocobalamin injection given to right deltoid.  Patient tolerated well. 

## 2020-12-28 ENCOUNTER — Other Ambulatory Visit: Payer: Self-pay

## 2020-12-28 ENCOUNTER — Ambulatory Visit (INDEPENDENT_AMBULATORY_CARE_PROVIDER_SITE_OTHER): Payer: Medicare HMO | Admitting: *Deleted

## 2020-12-28 DIAGNOSIS — E538 Deficiency of other specified B group vitamins: Secondary | ICD-10-CM

## 2020-12-28 NOTE — Progress Notes (Signed)
Pt here today for B12 injection given in left deltoid, pt tolerated well

## 2021-01-05 ENCOUNTER — Other Ambulatory Visit: Payer: Self-pay

## 2021-01-05 ENCOUNTER — Ambulatory Visit (INDEPENDENT_AMBULATORY_CARE_PROVIDER_SITE_OTHER): Payer: Medicare HMO | Admitting: Nurse Practitioner

## 2021-01-05 ENCOUNTER — Encounter: Payer: Self-pay | Admitting: Nurse Practitioner

## 2021-01-05 VITALS — BP 136/78 | HR 63 | Temp 97.1°F | Resp 20 | Ht 63.0 in | Wt 148.0 lb

## 2021-01-05 DIAGNOSIS — I10 Essential (primary) hypertension: Secondary | ICD-10-CM | POA: Diagnosis not present

## 2021-01-05 DIAGNOSIS — Z6826 Body mass index (BMI) 26.0-26.9, adult: Secondary | ICD-10-CM | POA: Diagnosis not present

## 2021-01-05 DIAGNOSIS — F17200 Nicotine dependence, unspecified, uncomplicated: Secondary | ICD-10-CM | POA: Diagnosis not present

## 2021-01-05 DIAGNOSIS — E039 Hypothyroidism, unspecified: Secondary | ICD-10-CM | POA: Diagnosis not present

## 2021-01-05 DIAGNOSIS — F3342 Major depressive disorder, recurrent, in full remission: Secondary | ICD-10-CM | POA: Diagnosis not present

## 2021-01-05 DIAGNOSIS — G629 Polyneuropathy, unspecified: Secondary | ICD-10-CM | POA: Diagnosis not present

## 2021-01-05 DIAGNOSIS — E782 Mixed hyperlipidemia: Secondary | ICD-10-CM | POA: Diagnosis not present

## 2021-01-05 DIAGNOSIS — F411 Generalized anxiety disorder: Secondary | ICD-10-CM

## 2021-01-05 MED ORDER — METOPROLOL SUCCINATE ER 100 MG PO TB24: 100.0000 mg | ORAL_TABLET | Freq: Every day | ORAL | 1 refills | Status: DC

## 2021-01-05 MED ORDER — CITALOPRAM HYDROBROMIDE 20 MG PO TABS: 20.0000 mg | ORAL_TABLET | Freq: Every day | ORAL | 1 refills | Status: DC

## 2021-01-05 MED ORDER — CLONAZEPAM 0.5 MG PO TABS: 0.5000 mg | ORAL_TABLET | Freq: Two times a day (BID) | ORAL | 5 refills | Status: DC

## 2021-01-05 MED ORDER — HYDROCHLOROTHIAZIDE 25 MG PO TABS: 25.0000 mg | ORAL_TABLET | Freq: Every day | ORAL | 1 refills | Status: DC

## 2021-01-05 MED ORDER — AMLODIPINE BESYLATE 5 MG PO TABS: 5.0000 mg | ORAL_TABLET | Freq: Every day | ORAL | 1 refills | Status: DC

## 2021-01-05 MED ORDER — LEVOTHYROXINE SODIUM 88 MCG PO TABS
88.0000 ug | ORAL_TABLET | Freq: Every day | ORAL | 1 refills | Status: DC
Start: 2021-01-05 — End: 2021-07-09

## 2021-01-05 MED ORDER — LISINOPRIL 40 MG PO TABS: 40.0000 mg | ORAL_TABLET | Freq: Every day | ORAL | 1 refills | Status: DC

## 2021-01-05 NOTE — Progress Notes (Signed)
Subjective:    Patient ID: Denise Stewart, female    DOB: 06-19-1940, 80 y.o.   MRN: [id number]  Chief Complaint: medical management of chronic issues     HPI:  1. Primary hypertension No c/o chest pain, sob or headache. Does  check blood pressure at home. Running 130-140 over 80. Says her blood pressure is always high in doctors office. BP Readings from Last 3 Encounters:  07/04/20 (!) 150/79  01/06/20 (!) 145/85  04/29/19 (!) 143/82     2. Mixed hyperlipidemia Does try to watch diet, but does no dedicated exercise. Lab Results  Component Value Date   CHOL 171 07/04/2020   HDL 45 07/04/2020   LDLCALC 104 (H) 07/04/2020   TRIG 125 07/04/2020   CHOLHDL 3.8 07/04/2020     3. Hypothyroidism, unspecified type No problems that she is aware of. Lab Results  Component Value Date   TSH 0.545 07/04/2020     4. Neuropathy Numbness and burning in feet. Is on neurontin but she has only been taking 2 a bedtime.    5. Current smoker Smokes over a pack a day. Has no desire to quit at this point  6. GAD (generalized anxiety disorder) Worries all the time about everything. Is on klonopin 2 times a day GAD 7 : Generalized Anxiety Score 01/05/2021 07/04/2020 01/06/2020 04/29/2019  Nervous, Anxious, on Edge 0 _0 Control/stop worrying 0 _1 Worry too much - different things 0 _2 Trouble relaxing 0 0 - 0  Restless 0 0 0 0  Easily annoyed or irritable 0 0 0 0  Afraid - awful might happen 0 0 0 1  Total GAD 7 Score 0 3 - 4  Anxiety Difficulty Not difficult at all Not difficult at all Somewhat difficult Not difficult at all     7. Recurrent major depressive disorder, in full remission ([place]) Has been on celexa for several years. Says working well for her. Depression screen [facility] 2/9 01/05/2021 07/04/2020 04/20/2020  Decreased Interest 0 0 0  Down, Depressed, Hopeless 0 0 0  PHQ - 2 Score 0 0 0  Altered sleeping 0 0 -  Tired, decreased energy 0 2 -  Change in appetite 0 0  -  Feeling bad or failure about yourself  0 0 -  Trouble concentrating 0 0 -  Moving slowly or fidgety/restless 0 0 -  Suicidal thoughts 0 0 -  PHQ-9 Score 0 2 -  Difficult doing work/chores Not difficult at all Not difficult at all -  Some recent data might be hidden     8. BMI 27.0-27.9,adult No recent weight changes  Wt Readings from Last 3 Encounters:  01/05/21 148 lb (67.1 kg)  07/04/20 150 lb (68 kg)  01/06/20 150 lb 9.6 oz (68.3 kg)   BMI Readings from Last 3 Encounters:  01/05/21 26.22 kg/m  07/04/20 26.57 kg/m  01/06/20 26.68 kg/m       Outpatient Encounter Medications as of 01/05/2021  Medication Sig   amLODipine (NORVASC) 5 MG tablet Take 1 tablet (5 mg total) by mouth daily.   aspirin 81 MG tablet Take 81 mg by mouth daily.   calcium carbonate (OS-CAL) 600 MG TABS Take 600 mg by mouth 2 (two) times daily with a meal.   Cholecalciferol (VITAMIN D-3) 1000 UNITS CAPS Take 1,000 Units by mouth daily.   citalopram (CELEXA) 20 MG tablet TAKE ONE (1) TABLET EACH DAY   clonazePAM (KLONOPIN) 0.5  MG tablet Take 1 tablet (0.5 mg total) by mouth 2 (two) times daily.   fish oil-omega-3 fatty acids 1000 MG capsule Take 2 g by mouth daily.   gabapentin (NEURONTIN) 100 MG capsule TAKE ONE (1) CAPSULE THREE (3) TIMES EACH DAY   hydrochlorothiazide (HYDRODIURIL) 25 MG tablet Take 1 tablet (25 mg total) by mouth daily.   levothyroxine (SYNTHROID) 88 MCG tablet Take 1 tablet (88 mcg total) by mouth daily.   lisinopril (ZESTRIL) 40 MG tablet TAKE ONE (1) TABLET EACH DAY   metoprolol succinate (TOPROL-XL) 100 MG 24 hr tablet TAKE 1 TABLET BY MOUTH DAILY WITH FOOD   Facility-Administered Encounter Medications as of 01/05/2021  Medication   cyanocobalamin ((VITAMIN B-12)) injection 1,000 mcg    Past Surgical History:  Procedure Laterality Date   EYE SURGERY Right 1970   right -UNDER eye lesion removed     Family History  Problem Relation Age of Onset   Hypertension Mother     Heart attack Mother 45   Heart disease Mother    Clotting disorder Father 47   Stroke Brother    Suicidality Sister    Other Sister        train accident   Cancer Sister        liver cancer   Diabetes Brother    Other Brother        storm accident   Diabetes Brother        leg removed    Cancer Brother        lung   Cancer Brother        lymphoma    New complaints: None today  Social history: Lives by herself. Has family that checks on her daily  Controlled substance contract: 07/05/20      Review of Systems  Constitutional:  Negative for diaphoresis.  Eyes:  Negative for pain.  Respiratory:  Negative for shortness of breath.   Cardiovascular:  Negative for chest pain, palpitations and leg swelling.  Gastrointestinal:  Negative for abdominal pain.  Endocrine: Negative for polydipsia.  Skin:  Negative for rash.  Neurological:  Negative for dizziness, weakness and headaches.  Hematological:  Does not bruise/bleed easily.  All other systems reviewed and are negative.     Objective:   Physical Exam Vitals and nursing note reviewed.  Constitutional:      General: She is not in acute distress.    Appearance: Normal appearance. She is well-developed.  HENT:     Head: Normocephalic.     Right Ear: Tympanic membrane normal.     Left Ear: Tympanic membrane normal.     Nose: Nose normal.     Mouth/Throat:     Mouth: Mucous membranes are moist.  Eyes:     Pupils: Pupils are equal, round, and reactive to light.  Neck:     Vascular: No carotid bruit or JVD.  Cardiovascular:     Rate and Rhythm: Normal rate and regular rhythm.     Heart sounds: Normal heart sounds.  Pulmonary:     Effort: Pulmonary effort is normal. No respiratory distress.     Breath sounds: Normal breath sounds. No wheezing or rales.  Chest:     Chest wall: No tenderness.  Abdominal:     General: Bowel sounds are normal. There is no distension or abdominal bruit.     Palpations: Abdomen  is soft. There is no hepatomegaly, splenomegaly, mass or pulsatile mass.     Tenderness: There is no abdominal  tenderness.  Musculoskeletal:        General: Normal range of motion.     Cervical back: Normal range of motion and neck supple.     Comments: gait slow and steady  Lymphadenopathy:     Cervical: No cervical adenopathy.  Skin:    General: Skin is warm and dry.  Neurological:     Mental Status: She is alert and oriented to person, place, and time.     Deep Tendon Reflexes: Reflexes are normal and symmetric.  Psychiatric:        Behavior: Behavior normal.        Thought Content: Thought content normal.        Judgment: Judgment normal.    BP 136/78   Pulse 63   Temp (!) 97.1 F (36.2 C) (Temporal)   Resp 20   Ht _0  (1.6 m)   Wt 148 lb (67.1 kg)   SpO2 95%   BMI 26.22 kg/m         Assessment & Plan:   Denise Stewart comes in today with chief complaint of Medical Management of Chronic Issues   Diagnosis and orders addressed:  1. Primary hypertension Low sodium ediet - metoprolol succinate (TOPROL-XL) 100 MG 24 hr tablet; Take 1 tablet (100 mg total) by mouth daily. with food  Dispense: 90 tablet; Refill: 1 - lisinopril (ZESTRIL) 40 MG tablet; Take 1 tablet (40 mg total) by mouth daily.  Dispense: 90 tablet; Refill: 1 - amLODipine (NORVASC) 5 MG tablet; Take 1 tablet (5 mg total) by mouth daily.  Dispense: 90 tablet; Refill: 1 - hydrochlorothiazide (HYDRODIURIL) 25 MG tablet; Take 1 tablet (25 mg total) by mouth daily.  Dispense: 90 tablet; Refill: 1 - CBC with Differential/Platelet - CMP14+EGFR  2. Mixed hyperlipidemia Low fat diet - Lipid panel  3. Hypothyroidism, unspecified type Labs pending - levothyroxine (SYNTHROID) 88 MCG tablet; Take 1 tablet (88 mcg total) by mouth daily.  Dispense: 90 tablet; Refill: 1 - Thyroid Panel With TSH  4. Neuropathy Do not go barefooted  5. Current smoker No desire to quit smoking  6. GAD (generalized anxiety  disorder) Stress management - clonazePAM (KLONOPIN) 0.5 MG tablet; Take 1 tablet (0.5 mg total) by mouth 2 (two) times daily.  Dispense: 60 tablet; Refill: 5  7. Recurrent major depressive disorder, in full remission ([place]) - citalopram (CELEXA) 20 MG tablet; Take 1 tablet (20 mg total) by mouth daily.  Dispense: 90 tablet; Refill: 1  8. BMI 26.0-26.9,adult Discussed diet and exercise for person with BMI >25 Will recheck weight in 3-6 months    Labs pending Health Maintenance reviewed Diet and exercise encouraged  Follow up plan: 6 months   Mary-Margaret Hassell Done, FNP

## 2021-01-05 NOTE — Patient Instructions (Signed)

## 2021-01-06 LAB — LIPID PANEL
Chol/HDL Ratio: 3.6 ratio (ref 0.0–4.4)
Cholesterol, Total: 172 mg/dL (ref 100–199)
HDL: 48 mg/dL (ref >39)
LDL Chol Calc (NIH): 101 mg/dL — ABNORMAL HIGH (ref 0–99)
Triglycerides: 131 mg/dL (ref 0–149)
VLDL Cholesterol Cal: 23 mg/dL (ref 5–40)

## 2021-01-06 LAB — CBC WITH DIFFERENTIAL/PLATELET
Basophils Absolute: 0.1 10*3/uL (ref 0.0–0.2)
Basos: 1 %
EOS (ABSOLUTE): 0.1 10*3/uL (ref 0.0–0.4)
Eos: 1 %
Hematocrit: 45 % (ref 34.0–46.6)
Hemoglobin: 14.9 g/dL (ref 11.1–15.9)
Immature Grans (Abs): 0 10*3/uL (ref 0.0–0.1)
Immature Granulocytes: 0 %
Lymphocytes Absolute: 2.3 10*3/uL (ref 0.7–3.1)
Lymphs: 26 %
MCH: 30.8 pg (ref 26.6–33.0)
MCHC: 33.1 g/dL (ref 31.5–35.7)
MCV: 93 fL (ref 79–97)
Monocytes Absolute: 0.7 10*3/uL (ref 0.1–0.9)
Monocytes: 7 %
Neutrophils Absolute: 5.8 10*3/uL (ref 1.4–7.0)
Neutrophils: 65 %
Platelets: 200 10*3/uL (ref 150–450)
RBC: 4.83 x10E6/uL (ref 3.77–5.28)
RDW: 12.3 % (ref 11.7–15.4)
WBC: 9 10*3/uL (ref 3.4–10.8)

## 2021-01-06 LAB — CMP14+EGFR
ALT: 8 IU/L (ref 0–32)
AST: 15 IU/L (ref 0–40)
Albumin/Globulin Ratio: 1.6 (ref 1.2–2.2)
Albumin: 4.4 g/dL (ref 3.6–4.6)
Alkaline Phosphatase: 101 IU/L (ref 44–121)
BUN/Creatinine Ratio: 11 — ABNORMAL LOW (ref 12–28)
BUN: 13 mg/dL (ref 8–27)
Bilirubin Total: 0.5 mg/dL (ref 0.0–1.2)
CO2: 26 mmol/L (ref 20–29)
Calcium: 9.2 mg/dL (ref 8.7–10.3)
Chloride: 98 mmol/L (ref 96–106)
Creatinine, Ser: 1.21 mg/dL — ABNORMAL HIGH (ref 0.57–1.00)
Globulin, Total: 2.8 g/dL (ref 1.5–4.5)
Glucose: 95 mg/dL (ref 70–99)
Potassium: 4 mmol/L (ref 3.5–5.2)
Sodium: 141 mmol/L (ref 134–144)
Total Protein: 7.2 g/dL (ref 6.0–8.5)
eGFR: 45 mL/min/{1.73_m2} — ABNORMAL LOW (ref >59)

## 2021-01-06 LAB — THYROID PANEL WITH TSH
Free Thyroxine Index: 2.8 (ref 1.2–4.9)
T3 Uptake Ratio: 27 % (ref 24–39)
T4, Total: 10.4 ug/dL (ref 4.5–12.0)
TSH: 0.463 u[IU]/mL (ref 0.450–4.500)

## 2021-01-29 ENCOUNTER — Ambulatory Visit (INDEPENDENT_AMBULATORY_CARE_PROVIDER_SITE_OTHER): Payer: Medicare HMO

## 2021-01-29 ENCOUNTER — Other Ambulatory Visit: Payer: Self-pay

## 2021-01-29 DIAGNOSIS — E538 Deficiency of other specified B group vitamins: Secondary | ICD-10-CM

## 2021-01-29 NOTE — Progress Notes (Signed)
Cyanocobalamin injection given to right deltoid.  Patient tolerated well. 

## 2021-03-05 ENCOUNTER — Ambulatory Visit (INDEPENDENT_AMBULATORY_CARE_PROVIDER_SITE_OTHER): Payer: Medicare HMO

## 2021-03-05 DIAGNOSIS — E538 Deficiency of other specified B group vitamins: Secondary | ICD-10-CM

## 2021-03-05 NOTE — Progress Notes (Signed)
Cyanocobalamin injection given to left deltoid.  Patient tolerated well. 

## 2021-04-04 ENCOUNTER — Ambulatory Visit (INDEPENDENT_AMBULATORY_CARE_PROVIDER_SITE_OTHER): Payer: Medicare HMO

## 2021-04-04 DIAGNOSIS — E538 Deficiency of other specified B group vitamins: Secondary | ICD-10-CM

## 2021-04-04 MED ORDER — CYANOCOBALAMIN 1000 MCG/ML IJ SOLN
1000.0000 ug | INTRAMUSCULAR | Status: AC
  Administered 2021-04-04 – 2022-03-25 (×12): 1000 ug via INTRAMUSCULAR

## 2021-04-04 NOTE — Progress Notes (Signed)
Cyanocobalamin injection given to right deltoid.  Patient tolerated well. 

## 2021-04-10 ENCOUNTER — Other Ambulatory Visit: Payer: Self-pay | Admitting: Nurse Practitioner

## 2021-04-23 ENCOUNTER — Ambulatory Visit (INDEPENDENT_AMBULATORY_CARE_PROVIDER_SITE_OTHER): Payer: Medicare HMO

## 2021-04-23 VITALS — Ht 63.0 in | Wt 148.0 lb

## 2021-04-23 DIAGNOSIS — Z0000 Encounter for general adult medical examination without abnormal findings: Secondary | ICD-10-CM | POA: Diagnosis not present

## 2021-04-23 NOTE — Progress Notes (Signed)
Subjective:   Denise Stewart is a 81 y.o. female who presents for Medicare Annual (Subsequent) preventive examination.  Virtual Visit via Telephone Note  I connected with  Denise Stewart on 04/23/21 at 11:15 AM EST by telephone and verified that I am speaking with the correct person using two identifiers.  Location: Patient: Home Provider: [facility] Persons participating in the virtual visit: patient/Nurse Health Advisor   I discussed the limitations, risks, security and privacy concerns of performing an evaluation and management service by telephone and the availability of in person appointments. The patient expressed understanding and agreed to proceed.  Interactive audio and video telecommunications were attempted between this nurse and patient, however failed, due to patient having technical difficulties OR patient did not have access to video capability.  We continued and completed visit with audio only.  Some vital signs may be absent or patient reported.   Denise Nicklaus [address], LPN   Review of Systems     Cardiac Risk Factors include: advanced age (>65men, >33 women);sedentary lifestyle;dyslipidemia;hypertension;smoking/ tobacco exposure     Objective:    Today's Vitals   04/23/21 1103  Weight: 148 lb (67.1 kg)  Height: 5\' 3"  (1.6 m)   Body mass index is 26.22 kg/m.  Advanced Directives 04/23/2021 04/20/2020 04/20/2019 05/13/2017 03/18/2014  Does Patient Have a Medical Advance Directive? Yes Yes Yes No No  Type of 14/02/2014 of Sales promotion account executive Power of [place];Living will - -  Does patient want to make changes to medical advance directive? - No - Patient declined No - Patient declined - -  Copy of Healthcare Power of Attorney in Chart? No - copy requested No - copy requested No - copy requested - -  Would patient like information on creating a medical advance directive? - - - Yes (MAU/Ambulatory/Procedural Areas - Information  given) Yes - Educational materials given    Current Medications (verified) Outpatient Encounter Medications as of 04/23/2021  Medication Sig   amLODipine (NORVASC) 5 MG tablet Take 1 tablet (5 mg total) by mouth daily.   aspirin 81 MG tablet Take 81 mg by mouth daily.   calcium carbonate (OS-CAL) 600 MG TABS Take 600 mg by mouth 2 (two) times daily with a meal.   Cholecalciferol (VITAMIN D-3) 1000 UNITS CAPS Take 1,000 Units by mouth daily.   citalopram (CELEXA) 20 MG tablet Take 1 tablet (20 mg total) by mouth daily.   clonazePAM (KLONOPIN) 0.5 MG tablet Take 1 tablet (0.5 mg total) by mouth 2 (two) times daily.   fish oil-omega-3 fatty acids 1000 MG capsule Take 2 g by mouth daily.   gabapentin (NEURONTIN) 100 MG capsule Take 100 mg by mouth 3 (three) times daily.   hydrochlorothiazide (HYDRODIURIL) 25 MG tablet Take 1 tablet (25 mg total) by mouth daily.   levothyroxine (SYNTHROID) 88 MCG tablet Take 1 tablet (88 mcg total) by mouth daily.   lisinopril (ZESTRIL) 40 MG tablet Take 1 tablet (40 mg total) by mouth daily.   metoprolol succinate (TOPROL-XL) 100 MG 24 hr tablet Take 1 tablet (100 mg total) by mouth daily. with food   Facility-Administered Encounter Medications as of 04/23/2021  Medication   cyanocobalamin ((VITAMIN B-12)) injection 1,000 mcg    Allergies (verified) Celexa [citalopram hydrobromide], Zocor [simvastatin], and Pravachol [pravastatin sodium]   History: Past Medical History:  Diagnosis Date   Depression    GAD (generalized anxiety disorder)    Hyperlipidemia    Hypertension    Thyroid  disease    Past Surgical History:  Procedure Laterality Date   EYE SURGERY Right 1970   right -UNDER eye lesion removed    Family History  Problem Relation Age of Onset   Hypertension Mother    Heart attack Mother [id number]   Heart disease Mother    Clotting disorder Father [id number]   Stroke Brother    Suicidality Sister    Other Sister        train accident   Cancer Sister         liver cancer   Diabetes Brother    Other Brother        storm accident   Diabetes Brother        leg removed    Cancer Brother        lung   Cancer Brother        lymphoma   Social History   Socioeconomic History   Marital status: Widowed    Spouse name: Not on file   Number of children: 2   Years of education: 10   Highest education level: 10th grade  Occupational History   Occupation: retired    Associate Professormployer: [employer]  Tobacco Use   Smoking status: Every Day    Packs/day: 0.50    Years: 64.00    Pack years: 32.00    Types: Cigarettes    Start date: 04/08/1953   Smokeless tobacco: Never  Vaping Use   Vaping Use: Never used  Substance and Sexual Activity   Alcohol use: No   Drug use: No   Sexual activity: Not Currently    Birth control/protection: Post-menopausal  Other Topics Concern   Not on file  Social History Narrative   Lives alone on one level -   Daughter and son call and visit several times daily   Social Determinants of Health   Financial Resource Strain: Low Risk    Difficulty of Paying Living Expenses: Not hard at all  Food Insecurity: No Food Insecurity   Worried About Programme researcher, broadcasting/film/videounning Out of Food in the Last Year: Never true   Baristaan Out of Food in the Last Year: Never true  Transportation Needs: No Transportation Needs   Lack of Transportation (Medical): No   Lack of Transportation (Non-Medical): No  Physical Activity: Insufficiently Active   Days of Exercise per Week: 4 days   Minutes of Exercise per Session: 20 min  Stress: No Stress Concern Present   Feeling of Stress : Not at all  Social Connections: Socially Isolated   Frequency of Communication with Friends and Family: More than three times a week   Frequency of Social Gatherings with Friends and Family: More than three times a week   Attends Religious Services: Never   Database administratorActive Member of Clubs or Organizations: No   Attends BankerClub or Organization Meetings: Never   Marital Status: Widowed     Tobacco Counseling Ready to quit: Yes Counseling given: Yes   Clinical Intake:  Pre-visit preparation completed: Yes  Pain : No/denies pain     BMI - recorded: 26.22 Nutritional Status: BMI 25 -29 Overweight Nutritional Risks: None Diabetes: No  How often do you need to have someone help you when you read instructions, pamphlets, or other written materials from your doctor or pharmacy?: 1 - Never  Diabetic? no  Interpreter Needed?: No  Information entered by :: Aragorn Recker, LPN   Activities of Daily Living In your present state of health, do you have any difficulty performing the  following activities: 04/23/2021  Hearing? N  Vision? N  Difficulty concentrating or making decisions? N  Walking or climbing stairs? Y  Dressing or bathing? N  Doing errands, shopping? Y  Comment her daughter or son drive her - she no longer drives  Quarry manager and eating ? N  Using the Toilet? N  In the past six months, have you accidently leaked urine? N  Do you have problems with loss of bowel control? N  Managing your Medications? N  Managing your Finances? N  Housekeeping or managing your Housekeeping? Y  Comment can do small things, no bending or kneeling  Some recent data might be hidden    Patient Care Team: Bennie Pierini, FNP as PCP - General (Nurse Practitioner)  Indicate any recent Medical Services you may have received from other than [facility] providers in the past year (date may be approximate).     Assessment:   This is a routine wellness examination for Denise Stewart.  Hearing/Vision screen Hearing Screening - Comments:: Denies hearing difficulties  Vision Screening - Comments:: Wears rx glasses prn reading - up to date with annual eye exams with [facility]as in [place]  Dietary issues and exercise activities discussed: Current Exercise Habits: Home exercise routine, Type of exercise: walking, Time (Minutes): 20, Frequency (Times/Week): 4, Weekly Exercise  (Minutes/Week): 80, Intensity: Mild, Exercise limited by: orthopedic condition(s);neurologic condition(s);respiratory conditions(s)   Goals Addressed             This Visit's Progress    Increase physical activity   On track    Prevent falls   On track    Wants to clean house       Depression Screen PHQ 2/9 Scores 04/23/2021 01/05/2021 07/04/2020 04/20/2020 01/06/2020 04/29/2019 04/20/2019  PHQ - 2 Score 0 0 0 0 0 0 1  PHQ- 9 Score - 0 2 - - - -    Fall Risk Fall Risk  04/23/2021 01/05/2021 07/04/2020 04/20/2020 01/06/2020  Falls in the past year? 1 0 0 1 1  Number falls in past yr: 0 - - 0 0  Injury with Fall? 0 - - 1 1  Comment - - - fracture ribs and shoulder -  Risk for fall due to : History of fall(s);Medication side effect;Other (Comment) - - History of fall(s) History of fall(s);Impaired balance/gait  Risk for fall due to: Comment neuropathy - - - -  Follow up Education provided;Falls prevention discussed - - Falls evaluation completed Falls evaluation completed    FALL RISK PREVENTION PERTAINING TO THE HOME:  Any stairs in or around the home? No  If so, are there any without handrails? No  Home free of loose throw rugs in walkways, pet beds, electrical cords, etc? Yes  Adequate lighting in your home to reduce risk of falls? Yes   ASSISTIVE DEVICES UTILIZED TO PREVENT FALLS:  Life alert? No  Use of a cane, walker or w/c? No  Grab bars in the bathroom? Yes  Shower chair or bench in shower? No  Elevated toilet seat or a handicapped toilet? Yes   TIMED UP AND GO:  Was the test performed? No . Telephonic visit  Cognitive Function: Normal cognitive status assessed by direct observation by this Nurse Health Advisor. No abnormalities found.    MMSE - Mini Mental State Exam 05/13/2017  Orientation to time 5  Orientation to Place 5  Registration 3  Attention/ Calculation 5  Recall 3  Language- name 2 objects 2  Language- repeat 1  Language- follow 3 step command 2   Language- read & follow direction 1  Write a sentence 1  Copy design 1  Total score 29     6CIT Screen 04/20/2020 04/20/2019  What Year? 0 points 0 points  What month? 0 points 0 points  What time? 0 points 0 points  Count back from 20 0 points 0 points  Months in reverse 0 points 0 points  Repeat phrase 0 points 0 points  Total Score 0 0    Immunizations Immunization History  Administered Date(s) Administered   Pneumococcal Conjugate-13 08/29/2014   Pneumococcal Polysaccharide-23 10/20/2015    TDAP status: Due, Education has been provided regarding the importance of this vaccine. Advised may receive this vaccine at local pharmacy or Health Dept. Aware to provide a copy of the vaccination record if obtained from local pharmacy or Health Dept. Verbalized acceptance and understanding.  Flu Vaccine status: Declined, Education has been provided regarding the importance of this vaccine but patient still declined. Advised may receive this vaccine at local pharmacy or Health Dept. Aware to provide a copy of the vaccination record if obtained from local pharmacy or Health Dept. Verbalized acceptance and understanding.  Pneumococcal vaccine status: Up to date  Covid-19 vaccine status: Declined, Education has been provided regarding the importance of this vaccine but patient still declined. Advised may receive this vaccine at local pharmacy or Health Dept.or vaccine clinic. Aware to provide a copy of the vaccination record if obtained from local pharmacy or Health Dept. Verbalized acceptance and understanding.  Qualifies for Shingles Vaccine? Yes   Zostavax completed No   Shingrix Completed?: No.    Education has been provided regarding the importance of this vaccine. Patient has been advised to call insurance company to determine out of pocket expense if they have not yet received this vaccine. Advised may also receive vaccine at local pharmacy or Health Dept. Verbalized acceptance and  understanding.  Screening Tests Health Maintenance  Topic Date Due   COVID-19 Vaccine (1) Never done   MAMMOGRAM  Never done   Zoster Vaccines- Shingrix (1 of 2) Never done   DEXA SCAN  06/24/2013   TETANUS/TDAP  07/04/2021 (Originally 09/19/1959)   INFLUENZA VACCINE  07/06/2021 (Originally 11/06/2020)   Pneumonia Vaccine 2965+ Years old  Completed   HPV VACCINES  Aged Out    Health Maintenance  Health Maintenance Due  Topic Date Due   COVID-19 Vaccine (1) Never done   MAMMOGRAM  Never done   Zoster Vaccines- Shingrix (1 of 2) Never done   DEXA SCAN  06/24/2013    Colorectal cancer screening: No longer required.   Mammogram status: No longer required due to age.  Bone Density status: Ordered 04/23/21. Pt provided with contact info and advised to call to schedule appt.  Lung Cancer Screening: (Low Dose CT Chest recommended if Age 65-80 years, 30 pack-year currently smoking OR have quit w/in 15years.) does not qualify.   Additional Screening:  Hepatitis C Screening: does not qualify  Vision Screening: Recommended annual ophthalmology exams for early detection of glaucoma and other disorders of the eye. Is the patient up to date with their annual eye exam?  Yes  Who is the provider or what is the name of the office in which the patient attends annual eye exams? [facility] [place] [facility] If pt is not established with a provider, would they like to be referred to a provider to establish care? No .   Dental Screening: Recommended annual  dental exams for proper oral hygiene  Community Resource Referral / Chronic Care Management: CRR required this visit?  No   CCM required this visit?  No      Plan:     I have personally reviewed and noted the following in the patients chart:   Medical and social history Use of alcohol, tobacco or illicit drugs  Current medications and supplements including opioid prescriptions.  Functional ability and status Nutritional  status Physical activity Advanced directives List of other physicians Hospitalizations, surgeries, and ER visits in previous 12 months Vitals Screenings to include cognitive, depression, and falls Referrals and appointments  In addition, I have reviewed and discussed with patient certain preventive protocols, quality metrics, and best practice recommendations. A written personalized care plan for preventive services as well as general preventive health recommendations were provided to patient.   Due to this being a telephonic visit, the after visit summary with patients personalized plan was offered to patient via mail or my-chart. Patient declined at this time.  [place] Constable, LPN   [id number]   Nurse Notes: [employer] mailed her a letter advising a DEXA - informed her we can do at next visit. Ordered and noted on next visit reason.

## 2021-04-23 NOTE — Patient Instructions (Signed)
Denise Stewart , Thank you for taking time to come for your Medicare Wellness Visit. I appreciate your ongoing commitment to your health goals. Please review the following plan we discussed and let me know if I can assist you in the future.   Screening recommendations/referrals: Colonoscopy: no longer required Mammogram: no longer required Bone Density: Done 06/24/2012 - Repeat in one year - Consider doing this at your next visit with Gennette Pac Recommended yearly ophthalmology/optometry visit for glaucoma screening and checkup Recommended yearly dental visit for hygiene and checkup  Vaccinations: Influenza vaccine: Declined Pneumococcal vaccine: Done 08/29/2014 & 10/20/2015 Tdap vaccine: Declined Shingles vaccine: Declined   Covid-19: Declined  Advanced directives: Please bring a copy of your health care power of attorney and living will to the office to be added to your chart at your convenience.   Conditions/risks identified: Aim for 30 minutes of exercise or walking each day, drink 6-8 glasses of water and eat lots of fruits and vegetables.   Next appointment: Follow up in one year for your annual wellness visit    Preventive Care 65 Years and Older, Female Preventive care refers to lifestyle choices and visits with your health care provider that can promote health and wellness. What does preventive care include? A yearly physical exam. This is also called an annual well check. Dental exams once or twice a year. Routine eye exams. Ask your health care provider how often you should have your eyes checked. Personal lifestyle choices, including: Daily care of your teeth and gums. Regular physical activity. Eating a healthy diet. Avoiding tobacco and drug use. Limiting alcohol use. Practicing safe sex. Taking low-dose aspirin every day. Taking vitamin and mineral supplements as recommended by your health care provider. What happens during an annual well check? The services and  screenings done by your health care provider during your annual well check will depend on your age, overall health, lifestyle risk factors, and family history of disease. Counseling  Your health care provider may ask you questions about your: Alcohol use. Tobacco use. Drug use. Emotional well-being. Home and relationship well-being. Sexual activity. Eating habits. History of falls. Memory and ability to understand (cognition). Work and work Astronomer. Reproductive health. Screening  You may have the following tests or measurements: Height, weight, and BMI. Blood pressure. Lipid and cholesterol levels. These may be checked every 5 years, or more frequently if you are over 15 years old. Skin check. Lung cancer screening. You may have this screening every year starting at age 27 if you have a 30-pack-year history of smoking and currently smoke or have quit within the past 15 years. Fecal occult blood test (FOBT) of the stool. You may have this test every year starting at age 63. Flexible sigmoidoscopy or colonoscopy. You may have a sigmoidoscopy every 5 years or a colonoscopy every 10 years starting at age 58. Hepatitis C blood test. Hepatitis B blood test. Sexually transmitted disease (STD) testing. Diabetes screening. This is done by checking your blood sugar (glucose) after you have not eaten for a while (fasting). You may have this done every 1-3 years. Bone density scan. This is done to screen for osteoporosis. You may have this done starting at age 21. Mammogram. This may be done every 1-2 years. Talk to your health care provider about how often you should have regular mammograms. Talk with your health care provider about your test results, treatment options, and if necessary, the need for more tests. Vaccines  Your health care provider may  recommend certain vaccines, such as: Influenza vaccine. This is recommended every year. Tetanus, diphtheria, and acellular pertussis (Tdap,  Td) vaccine. You may need a Td booster every 10 years. Zoster vaccine. You may need this after age 88. Pneumococcal 13-valent conjugate (PCV13) vaccine. One dose is recommended after age 38. Pneumococcal polysaccharide (PPSV23) vaccine. One dose is recommended after age 74. Talk to your health care provider about which screenings and vaccines you need and how often you need them. This information is not intended to replace advice given to you by your health care provider. Make sure you discuss any questions you have with your health care provider. Document Released: 04/21/2015 Document Revised: 12/13/2015 Document Reviewed: 01/24/2015 Elsevier Interactive Patient Education  2017 [place] Prevention in the Home Falls can cause injuries. They can happen to people of all ages. There are many things you can do to make your home safe and to help prevent falls. What can I do on the outside of my home? Regularly fix the edges of walkways and driveways and fix any cracks. Remove anything that might make you trip as you walk through a door, such as a raised step or threshold. Trim any bushes or trees on the path to your home. Use bright outdoor lighting. Clear any walking paths of anything that might make someone trip, such as rocks or tools. Regularly check to see if handrails are loose or broken. Make sure that both sides of any steps have handrails. Any raised decks and porches should have guardrails on the edges. Have any leaves, snow, or ice cleared regularly. Use sand or salt on walking paths during winter. Clean up any spills in your garage right away. This includes oil or grease spills. What can I do in the bathroom? Use night lights. Install grab bars by the toilet and in the tub and shower. Do not use towel bars as grab bars. Use non-skid mats or decals in the tub or shower. If you need to sit down in the shower, use a plastic, non-slip stool. Keep the floor dry. Clean up any  water that spills on the floor as soon as it happens. Remove soap buildup in the tub or shower regularly. Attach bath mats securely with double-sided non-slip rug tape. Do not have throw rugs and other things on the floor that can make you trip. What can I do in the bedroom? Use night lights. Make sure that you have a light by your bed that is easy to reach. Do not use any sheets or blankets that are too big for your bed. They should not hang down onto the floor. Have a firm chair that has side arms. You can use this for support while you get dressed. Do not have throw rugs and other things on the floor that can make you trip. What can I do in the kitchen? Clean up any spills right away. Avoid walking on wet floors. Keep items that you use a lot in easy-to-reach places. If you need to reach something above you, use a strong step stool that has a grab bar. Keep electrical cords out of the way. Do not use floor polish or wax that makes floors slippery. If you must use wax, use non-skid floor wax. Do not have throw rugs and other things on the floor that can make you trip. What can I do with my stairs? Do not leave any items on the stairs. Make sure that there are handrails on both sides of  the stairs and use them. Fix handrails that are broken or loose. Make sure that handrails are as long as the stairways. Check any carpeting to make sure that it is firmly attached to the stairs. Fix any carpet that is loose or worn. Avoid having throw rugs at the top or bottom of the stairs. If you do have throw rugs, attach them to the floor with carpet tape. Make sure that you have a light switch at the top of the stairs and the bottom of the stairs. If you do not have them, ask someone to add them for you. What else can I do to help prevent falls? Wear shoes that: Do not have high heels. Have rubber bottoms. Are comfortable and fit you well. Are closed at the toe. Do not wear sandals. If you use a  stepladder: Make sure that it is fully opened. Do not climb a closed stepladder. Make sure that both sides of the stepladder are locked into place. Ask someone to hold it for you, if possible. Clearly mark and make sure that you can see: Any grab bars or handrails. First and last steps. Where the edge of each step is. Use tools that help you move around (mobility aids) if they are needed. These include: Canes. Walkers. Scooters. Crutches. Turn on the lights when you go into a dark area. Replace any light bulbs as soon as they burn out. Set up your furniture so you have a clear path. Avoid moving your furniture around. If any of your floors are uneven, fix them. If there are any pets around you, be aware of where they are. Review your medicines with your doctor. Some medicines can make you feel dizzy. This can increase your chance of falling. Ask your doctor what other things that you can do to help prevent falls. This information is not intended to replace advice given to you by your health care provider. Make sure you discuss any questions you have with your health care provider. Document Released: 01/19/2009 Document Revised: 08/31/2015 Document Reviewed: 04/29/2014 Elsevier Interactive Patient Education  2017 [employer].

## 2021-05-07 ENCOUNTER — Ambulatory Visit (INDEPENDENT_AMBULATORY_CARE_PROVIDER_SITE_OTHER): Payer: Medicare HMO | Admitting: *Deleted

## 2021-05-07 DIAGNOSIS — E538 Deficiency of other specified B group vitamins: Secondary | ICD-10-CM

## 2021-05-07 NOTE — Progress Notes (Signed)
Pt given B12 injection IM left deltoid and tolerated well. °

## 2021-06-08 ENCOUNTER — Ambulatory Visit (INDEPENDENT_AMBULATORY_CARE_PROVIDER_SITE_OTHER): Payer: Medicare HMO

## 2021-06-08 DIAGNOSIS — E538 Deficiency of other specified B group vitamins: Secondary | ICD-10-CM | POA: Diagnosis not present

## 2021-06-08 NOTE — Progress Notes (Signed)
Pt given b12 inj IM r-deltoid . Pt tol tx well. ?

## 2021-07-03 ENCOUNTER — Ambulatory Visit (INDEPENDENT_AMBULATORY_CARE_PROVIDER_SITE_OTHER): Payer: Medicare HMO | Admitting: Nurse Practitioner

## 2021-07-03 ENCOUNTER — Encounter: Payer: Self-pay | Admitting: Nurse Practitioner

## 2021-07-03 VITALS — BP 120/66 | HR 65 | Temp 97.4°F | Ht 63.0 in | Wt 146.6 lb

## 2021-07-03 DIAGNOSIS — E782 Mixed hyperlipidemia: Secondary | ICD-10-CM | POA: Diagnosis not present

## 2021-07-03 DIAGNOSIS — Z6825 Body mass index (BMI) 25.0-25.9, adult: Secondary | ICD-10-CM

## 2021-07-03 DIAGNOSIS — E039 Hypothyroidism, unspecified: Secondary | ICD-10-CM

## 2021-07-03 DIAGNOSIS — Z6827 Body mass index (BMI) 27.0-27.9, adult: Secondary | ICD-10-CM

## 2021-07-03 DIAGNOSIS — G629 Polyneuropathy, unspecified: Secondary | ICD-10-CM

## 2021-07-03 DIAGNOSIS — F17200 Nicotine dependence, unspecified, uncomplicated: Secondary | ICD-10-CM

## 2021-07-03 DIAGNOSIS — I10 Essential (primary) hypertension: Secondary | ICD-10-CM

## 2021-07-03 DIAGNOSIS — F411 Generalized anxiety disorder: Secondary | ICD-10-CM

## 2021-07-03 DIAGNOSIS — F3342 Major depressive disorder, recurrent, in full remission: Secondary | ICD-10-CM | POA: Diagnosis not present

## 2021-07-03 MED ORDER — METOPROLOL SUCCINATE ER 100 MG PO TB24: 100.0000 mg | ORAL_TABLET | Freq: Every day | ORAL | 1 refills | Status: DC

## 2021-07-03 MED ORDER — AMLODIPINE BESYLATE 5 MG PO TABS: 5.0000 mg | ORAL_TABLET | Freq: Every day | ORAL | 1 refills | Status: DC

## 2021-07-03 MED ORDER — HYDROCHLOROTHIAZIDE 25 MG PO TABS: 25.0000 mg | ORAL_TABLET | Freq: Every day | ORAL | 1 refills | Status: DC

## 2021-07-03 MED ORDER — LISINOPRIL 40 MG PO TABS: 40.0000 mg | ORAL_TABLET | Freq: Every day | ORAL | 1 refills | Status: DC

## 2021-07-03 MED ORDER — GABAPENTIN 100 MG PO CAPS: 100.0000 mg | ORAL_CAPSULE | Freq: Three times a day (TID) | ORAL | 5 refills | Status: DC

## 2021-07-03 MED ORDER — CITALOPRAM HYDROBROMIDE 20 MG PO TABS: 20.0000 mg | ORAL_TABLET | Freq: Every day | ORAL | 1 refills | Status: DC

## 2021-07-03 NOTE — Progress Notes (Signed)
? ?Subjective:  ? ? Patient ID: Denise Stewart, female    DOB: 1940/04/17, 81 y.o.   MRN: [id number] ? ? ?Chief Complaint: medical management of chronic issues  ?  ? ?HPI: ? ?Denise Stewart is a 81 y.o. who identifies as a female who was assigned female at birth.  ? 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 ? ?Comes in today for follow up of the following chronic medical issues: ? ?1. Primary hypertension ?No c/o chest pain, sob or headache. Does no check blood pressure at home. ?BP Readings from Last 3 Encounters:  ?01/05/21 136/78  ?07/04/20 (!) 150/79  ?01/06/20 (!) 145/85  ? ? ? ?2. Mixed hyperlipidemia ?Does not really watch diet and does no dedicated exercise. ?Lab Results  ?Component Value Date  ? CHOL 172 01/05/2021  ? HDL 48 01/05/2021  ? LDLCALC 101 (H) 01/05/2021  ? TRIG 131 01/05/2021  ? CHOLHDL 3.6 01/05/2021  ?The ASCVD Risk score (Arnett DK, et al., 2019) failed to calculate for the following reasons: ?  The 2019 ASCVD risk score is only valid for ages 53 to 41 ? ? ? ?3. Hypothyroidism, unspecified type ?No problems that she is aware of. ?Lab Results  ?Component Value Date  ? TSH 0.463 01/05/2021  ? ? ? ?4. Neuropathy ?Is on neurontin and that helps with tingling in her eet. ? ?5. Recurrent major depressive disorder, in full remission ([place]) ?Is on celexa daily and is doing well. ? ?  04/23/2021  ? 11:13 AM 01/05/2021  ?  2:37 PM 07/04/2020  ?  4:03 PM  ?Depression screen PHQ 2/9  ?Decreased Interest 0 0 0  ?Down, Depressed, Hopeless 0 0 0  ?PHQ - 2 Score 0 0 0  ?Altered sleeping  0 0  ?Tired, decreased energy  0 2  ?Change in appetite  0 0  ?Feeling bad or failure about yourself   0 0  ?Trouble concentrating  0 0  ?Moving slowly or fidgety/restless  0 0  ?Suicidal thoughts  0 0  ?PHQ-9 Score  0 2  ?Difficult doing work/chores  Not difficult at all Not difficult at all  ? ? ? ? ?6. GAD (generalized anxiety disorder) ?Is very anxious and worries  about everything. She is on klonopin bid ? ?  01/05/2021  ?  2:37 PM 07/04/2020  ?  4:04 PM 01/06/2020  ?  4:20 PM 04/29/2019  ?  4:00 PM  ?GAD 7 : Generalized Anxiety Score  ?Nervous, Anxious, on Edge 0 _0 ?Control/stop worrying 0 _1 ?Worry too much - different things 0 _2 ?Trouble relaxing 0 0  0  ?Restless 0 0 0 0  ?Easily annoyed or irritable 0 0 0 0  ?Afraid - awful might happen 0 0 0 1  ?Total GAD 7 Score 0 3  4  ?Anxiety Difficulty Not difficult at all Not difficult at all Somewhat difficult Not difficult at all  ? ? ? ? ?7. Current smoker ?Is a smoker. Has no desire to quit. Refuses low dose CT scan. ? ?8. BMI 26.0-26.9,adult ?No recent weight changes ?Wt Readings from Last 3 Encounters:  ?07/03/21 146 lb 9.6 oz (66.5 kg)  ?04/23/21 148 lb (67.1 kg)  ?01/05/21 148 lb (67.1 kg)  ? ?BMI Readings from Last 3 Encounters:  ?07/03/21 25.97 kg/m?  ?04/23/21 26.22 kg/m?  ?01/05/21 26.22 kg/m?  ? ? ? ?New complaints: ?None  today ? ?Allergies  ?Allergen Reactions  ? Celexa [Citalopram Hydrobromide] Anaphylaxis, Shortness Of Breath and Swelling  ? Zocor [Simvastatin]   ?  myalsia  ? Pravachol [Pravastatin Sodium] Rash  ? ?Outpatient Encounter Medications as of 07/03/2021  ?Medication Sig  ? amLODipine (NORVASC) 5 MG tablet Take 1 tablet (5 mg total) by mouth daily.  ? aspirin 81 MG tablet Take 81 mg by mouth daily.  ? calcium carbonate (OS-CAL) 600 MG TABS Take 600 mg by mouth 2 (two) times daily with a meal.  ? Cholecalciferol (VITAMIN D-3) 1000 UNITS CAPS Take 1,000 Units by mouth daily.  ? citalopram (CELEXA) 20 MG tablet Take 1 tablet (20 mg total) by mouth daily.  ? clonazePAM (KLONOPIN) 0.5 MG tablet Take 1 tablet (0.5 mg total) by mouth 2 (two) times daily.  ? fish oil-omega-3 fatty acids 1000 MG capsule Take 2 g by mouth daily.  ? gabapentin (NEURONTIN) 100 MG capsule Take 100 mg by mouth 3 (three) times daily.  ? hydrochlorothiazide (HYDRODIURIL) 25 MG tablet Take 1 tablet (25 mg total) by mouth daily.   ? levothyroxine (SYNTHROID) 88 MCG tablet Take 1 tablet (88 mcg total) by mouth daily.  ? lisinopril (ZESTRIL) 40 MG tablet Take 1 tablet (40 mg total) by mouth daily.  ? metoprolol succinate (TOPROL-XL) 100 MG 24 hr tablet Take 1 tablet (100 mg total) by mouth daily. with food  ? ?Facility-Administered Encounter Medications as of 07/03/2021  ?Medication  ? cyanocobalamin ((VITAMIN B-12)) injection 1,000 mcg  ? ? ?Past Surgical History:  ?Procedure Laterality Date  ? EYE SURGERY Right 1970  ? right -UNDER eye lesion removed   ? ? ?Family History  ?Problem Relation Age of Onset  ? Hypertension Mother   ? Heart attack Mother 82  ? Heart disease Mother   ? Clotting disorder Father 100  ? Stroke Brother   ? Suicidality Sister   ? Other Sister   ?     train accident  ? Cancer Sister   ?     liver cancer  ? Diabetes Brother   ? Other Brother   ?     storm accident  ? Diabetes Brother   ?     leg removed   ? Cancer Brother   ?     lung  ? Cancer Brother   ?     lymphoma  ? ? ? ? ?Controlled substance contract: n/a ? ? ? ? ?Review of Systems  ?Constitutional:  Negative for diaphoresis.  ?Eyes:  Negative for pain.  ?Respiratory:  Negative for shortness of breath.   ?Cardiovascular:  Negative for chest pain, palpitations and leg swelling.  ?Gastrointestinal:  Negative for abdominal pain.  ?Endocrine: Negative for polydipsia.  ?Skin:  Negative for rash.  ?Neurological:  Negative for dizziness, weakness and headaches.  ?Hematological:  Does not bruise/bleed easily.  ?All other systems reviewed and are negative. ? ?   ?Objective:  ? Physical Exam ?Vitals and nursing note reviewed.  ?Constitutional:   ?   General: She is not in acute distress. ?   Appearance: Normal appearance. She is well-developed.  ?HENT:  ?   Head: Normocephalic.  ?   Right Ear: Tympanic membrane normal.  ?   Left Ear: Tympanic membrane normal.  ?   Nose: Nose normal.  ?   Mouth/Throat:  ?   Mouth: Mucous membranes are moist.  ?Eyes:  ?   Pupils: Pupils are  equal, round, and reactive to light.  ?  Neck:  ?   Vascular: No carotid bruit or JVD.  ?Cardiovascular:  ?   Rate and Rhythm: Normal rate and regular rhythm.  ?   Heart sounds: Normal heart sounds.  ?Pulmonary:  ?   Effort: Pulmonary effort is normal. No respiratory distress.  ?   Breath sounds: Normal breath sounds. No wheezing or rales.  ?Chest:  ?   Chest wall: No tenderness.  ?Abdominal:  ?   General: Bowel sounds are normal. There is no distension or abdominal bruit.  ?   Palpations: Abdomen is soft. There is no hepatomegaly, splenomegaly, mass or pulsatile mass.  ?   Tenderness: There is no abdominal tenderness.  ?Musculoskeletal:     ?   General: Normal range of motion.  ?   Cervical back: Normal range of motion and neck supple.  ?Lymphadenopathy:  ?   Cervical: No cervical adenopathy.  ?Skin: ?   General: Skin is warm and dry.  ?Neurological:  ?   Mental Status: She is alert and oriented to person, place, and time.  ?   Deep Tendon Reflexes: Reflexes are normal and symmetric.  ?Psychiatric:     ?   Behavior: Behavior normal.     ?   Thought Content: Thought content normal.     ?   Judgment: Judgment normal.  ? ? ?BP 120/66   Pulse 65   Temp (!) 97.4 ?F (36.3 ?C) (Oral)   Ht _0  (1.6 m)   Wt 146 lb 9.6 oz (66.5 kg)   SpO2 95%   BMI 25.97 kg/m?  ? ? ? ?   ?Assessment & Plan:  ? ?Denise Stewart comes in today with chief complaint of Medical Management of Chronic Issues ? ? ?Diagnosis and orders addressed: ? ?1. Primary hypertension ?Low sodium diet ?- amLODipine (NORVASC) 5 MG tablet; Take 1 tablet (5 mg total) by mouth daily.  Dispense: 90 tablet; Refill: 1 ?- lisinopril (ZESTRIL) 40 MG tablet; Take 1 tablet (40 mg total) by mouth daily.  Dispense: 90 tablet; Refill: 1 ?- hydrochlorothiazide (HYDRODIURIL) 25 MG tablet; Take 1 tablet (25 mg total) by mouth daily.  Dispense: 90 tablet; Refill: 1 ?- metoprolol succinate (TOPROL-XL) 100 MG 24 hr tablet; Take 1 tablet (100 mg total) by mouth daily. with food   Dispense: 90 tablet; Refill: 1 ?- CBC with Differential/Platelet ?- CMP14+EGFR ? ?2. Mixed hyperlipidemia ?Low fat diet ?- Lipid panel ? ?3. Hypothyroidism, unspecified type ?Labs pending ? ?4. Neuropathy ?

## 2021-07-03 NOTE — Patient Instructions (Signed)

## 2021-07-04 LAB — CMP14+EGFR
ALT: 11 IU/L (ref 0–32)
AST: 16 IU/L (ref 0–40)
Albumin/Globulin Ratio: 1.8 (ref 1.2–2.2)
Albumin: 4.4 g/dL (ref 3.6–4.6)
Alkaline Phosphatase: 89 IU/L (ref 44–121)
BUN/Creatinine Ratio: 13 (ref 12–28)
BUN: 16 mg/dL (ref 8–27)
Bilirubin Total: 0.6 mg/dL (ref 0.0–1.2)
CO2: 26 mmol/L (ref 20–29)
Calcium: 9 mg/dL (ref 8.7–10.3)
Chloride: 100 mmol/L (ref 96–106)
Creatinine, Ser: 1.27 mg/dL — ABNORMAL HIGH (ref 0.57–1.00)
Globulin, Total: 2.5 g/dL (ref 1.5–4.5)
Glucose: 91 mg/dL (ref 70–99)
Potassium: 4.3 mmol/L (ref 3.5–5.2)
Sodium: 139 mmol/L (ref 134–144)
Total Protein: 6.9 g/dL (ref 6.0–8.5)
eGFR: 42 mL/min/{1.73_m2} — ABNORMAL LOW (ref >59)

## 2021-07-04 LAB — LIPID PANEL
Chol/HDL Ratio: 3.8 ratio (ref 0.0–4.4)
Cholesterol, Total: 177 mg/dL (ref 100–199)
HDL: 47 mg/dL (ref >39)
LDL Chol Calc (NIH): 107 mg/dL — ABNORMAL HIGH (ref 0–99)
Triglycerides: 131 mg/dL (ref 0–149)
VLDL Cholesterol Cal: 23 mg/dL (ref 5–40)

## 2021-07-04 LAB — CBC WITH DIFFERENTIAL/PLATELET
Basophils Absolute: 0.1 10*3/uL (ref 0.0–0.2)
Basos: 1 %
EOS (ABSOLUTE): 0.1 10*3/uL (ref 0.0–0.4)
Eos: 1 %
Hematocrit: 43.9 % (ref 34.0–46.6)
Hemoglobin: 15.1 g/dL (ref 11.1–15.9)
Immature Grans (Abs): 0 10*3/uL (ref 0.0–0.1)
Immature Granulocytes: 0 %
Lymphocytes Absolute: 2.2 10*3/uL (ref 0.7–3.1)
Lymphs: 28 %
MCH: 31.7 pg (ref 26.6–33.0)
MCHC: 34.4 g/dL (ref 31.5–35.7)
MCV: 92 fL (ref 79–97)
Monocytes Absolute: 0.7 10*3/uL (ref 0.1–0.9)
Monocytes: 9 %
Neutrophils Absolute: 5 10*3/uL (ref 1.4–7.0)
Neutrophils: 61 %
Platelets: 213 10*3/uL (ref 150–450)
RBC: 4.76 x10E6/uL (ref 3.77–5.28)
RDW: 12.3 % (ref 11.7–15.4)
WBC: 8 10*3/uL (ref 3.4–10.8)

## 2021-07-09 ENCOUNTER — Other Ambulatory Visit: Payer: Self-pay | Admitting: Nurse Practitioner

## 2021-07-09 DIAGNOSIS — E039 Hypothyroidism, unspecified: Secondary | ICD-10-CM

## 2021-07-09 DIAGNOSIS — F411 Generalized anxiety disorder: Secondary | ICD-10-CM

## 2021-07-11 ENCOUNTER — Ambulatory Visit (INDEPENDENT_AMBULATORY_CARE_PROVIDER_SITE_OTHER): Payer: Medicare HMO | Admitting: *Deleted

## 2021-07-11 DIAGNOSIS — E538 Deficiency of other specified B group vitamins: Secondary | ICD-10-CM | POA: Diagnosis not present

## 2021-08-15 ENCOUNTER — Ambulatory Visit (INDEPENDENT_AMBULATORY_CARE_PROVIDER_SITE_OTHER): Payer: Medicare HMO | Admitting: Emergency Medicine

## 2021-08-15 DIAGNOSIS — E538 Deficiency of other specified B group vitamins: Secondary | ICD-10-CM | POA: Diagnosis not present

## 2021-09-17 ENCOUNTER — Ambulatory Visit (INDEPENDENT_AMBULATORY_CARE_PROVIDER_SITE_OTHER): Payer: Medicare HMO | Admitting: *Deleted

## 2021-09-17 DIAGNOSIS — E538 Deficiency of other specified B group vitamins: Secondary | ICD-10-CM | POA: Diagnosis not present

## 2021-10-02 ENCOUNTER — Other Ambulatory Visit: Payer: Self-pay | Admitting: Nurse Practitioner

## 2021-10-02 DIAGNOSIS — F411 Generalized anxiety disorder: Secondary | ICD-10-CM

## 2021-10-17 ENCOUNTER — Ambulatory Visit (INDEPENDENT_AMBULATORY_CARE_PROVIDER_SITE_OTHER): Payer: Medicare HMO

## 2021-10-17 DIAGNOSIS — E538 Deficiency of other specified B group vitamins: Secondary | ICD-10-CM | POA: Diagnosis not present

## 2021-10-17 NOTE — Progress Notes (Signed)
Cyanocobalamin injection given to right deltoid.  Patient tolerated well. 

## 2021-11-19 ENCOUNTER — Ambulatory Visit (INDEPENDENT_AMBULATORY_CARE_PROVIDER_SITE_OTHER): Payer: Medicare HMO

## 2021-11-19 DIAGNOSIS — E538 Deficiency of other specified B group vitamins: Secondary | ICD-10-CM | POA: Diagnosis not present

## 2021-11-19 NOTE — Progress Notes (Signed)
Cyanocobalamin injection given to left deltoid.  Patient tolerated well. 

## 2021-12-21 ENCOUNTER — Ambulatory Visit (INDEPENDENT_AMBULATORY_CARE_PROVIDER_SITE_OTHER): Payer: Medicare HMO

## 2021-12-21 DIAGNOSIS — E538 Deficiency of other specified B group vitamins: Secondary | ICD-10-CM

## 2022-01-03 ENCOUNTER — Ambulatory Visit (INDEPENDENT_AMBULATORY_CARE_PROVIDER_SITE_OTHER): Payer: Medicare HMO | Admitting: Nurse Practitioner

## 2022-01-03 ENCOUNTER — Encounter: Payer: Self-pay | Admitting: Nurse Practitioner

## 2022-01-03 VITALS — BP 136/82 | HR 64 | Temp 97.5°F | Ht 63.0 in | Wt 144.8 lb

## 2022-01-03 DIAGNOSIS — F411 Generalized anxiety disorder: Secondary | ICD-10-CM

## 2022-01-03 DIAGNOSIS — G629 Polyneuropathy, unspecified: Secondary | ICD-10-CM | POA: Diagnosis not present

## 2022-01-03 DIAGNOSIS — F17200 Nicotine dependence, unspecified, uncomplicated: Secondary | ICD-10-CM | POA: Diagnosis not present

## 2022-01-03 DIAGNOSIS — E039 Hypothyroidism, unspecified: Secondary | ICD-10-CM | POA: Diagnosis not present

## 2022-01-03 DIAGNOSIS — E782 Mixed hyperlipidemia: Secondary | ICD-10-CM

## 2022-01-03 DIAGNOSIS — I10 Essential (primary) hypertension: Secondary | ICD-10-CM | POA: Diagnosis not present

## 2022-01-03 DIAGNOSIS — F3342 Major depressive disorder, recurrent, in full remission: Secondary | ICD-10-CM

## 2022-01-03 DIAGNOSIS — Z6827 Body mass index (BMI) 27.0-27.9, adult: Secondary | ICD-10-CM

## 2022-01-03 MED ORDER — LISINOPRIL 40 MG PO TABS: 40.0000 mg | ORAL_TABLET | Freq: Every day | ORAL | 1 refills | Status: DC

## 2022-01-03 MED ORDER — HYDROCHLOROTHIAZIDE 25 MG PO TABS: 25.0000 mg | ORAL_TABLET | Freq: Every day | ORAL | 1 refills | Status: DC

## 2022-01-03 MED ORDER — GABAPENTIN 100 MG PO CAPS: 100.0000 mg | ORAL_CAPSULE | Freq: Three times a day (TID) | ORAL | 5 refills | Status: DC

## 2022-01-03 MED ORDER — CLONAZEPAM 0.5 MG PO TABS: ORAL_TABLET | ORAL | 5 refills | Status: DC

## 2022-01-03 MED ORDER — LEVOTHYROXINE SODIUM 88 MCG PO TABS: ORAL_TABLET | ORAL | 1 refills | Status: DC

## 2022-01-03 MED ORDER — METOPROLOL SUCCINATE ER 100 MG PO TB24: 100.0000 mg | ORAL_TABLET | Freq: Every day | ORAL | 1 refills | Status: DC

## 2022-01-03 MED ORDER — AMLODIPINE BESYLATE 5 MG PO TABS: 5.0000 mg | ORAL_TABLET | Freq: Every day | ORAL | 1 refills | Status: DC

## 2022-01-03 NOTE — Patient Instructions (Signed)

## 2022-01-03 NOTE — Progress Notes (Signed)
Subjective:    Patient ID: Denise Stewart, female    DOB: 1941/01/31, 81 y.o.   MRN: [id number]   Chief Complaint: medical management of chronic issues     HPI:  Denise Stewart is a 81 y.o. who identifies as a female who was assigned female at birth.   Social history: Lives with: by herself- her family checks on her daily Work history: retired   Scientist, forensic in today for follow up of the following chronic medical issues:  1. Primary hypertension No c/o chest pain, sob or headache. Does not check blood pressure at home. BP Readings from Last 3 Encounters:  07/03/21 120/66  01/05/21 136/78  07/04/20 (!) 150/79     2. Mixed hyperlipidemia Does try to watch diet but does no exercise. Lab Results  Component Value Date   CHOL 177 07/03/2021   HDL 47 07/03/2021   LDLCALC 107 (H) 07/03/2021   TRIG 131 07/03/2021   CHOLHDL 3.8 07/03/2021      3. Hypothyroidism, unspecified type No problems taht patient is aware of. Lab Results  Component Value Date   TSH 0.463 01/05/2021     4. Neuropathy Has tingling in feet at times. Occasional burning.  5. Recurrent major depressive disorder, in full remission ([place]) Is on celexa and is doing well.    01/03/2022    3:29 PM 04/23/2021   11:13 AM 01/05/2021    2:37 PM  Depression screen PHQ 2/9  Decreased Interest 1 0 0  Down, Depressed, Hopeless 1 0 0  PHQ - 2 Score 2 0 0  Altered sleeping 0  0  Tired, decreased energy 1  0  Change in appetite 0  0  Feeling bad or failure about yourself  0  0  Trouble concentrating 0  0  Moving slowly or fidgety/restless 0  0  Suicidal thoughts 0  0  PHQ-9 Score 3  0  Difficult doing work/chores Somewhat difficult  Not difficult at all      6. GAD (generalized anxiety disorder) Has a nervous personality and worries about everything.    01/03/2022    3:30 PM 01/05/2021    2:37 PM 07/04/2020    4:04 PM 01/06/2020    4:20 PM  GAD 7 : Generalized Anxiety Score  Nervous, Anxious, on Edge 1 0  1 2  Control/stop worrying 0 0 1 2  Worry too much - different things 0 0 1 2  Trouble relaxing 0 0 0   Restless 0 0 0 0  Easily annoyed or irritable 0 0 0 0  Afraid - awful might happen 0 0 0 0  Total GAD 7 Score 1 0 3   Anxiety Difficulty Not difficult at all Not difficult at all Not difficult at all Somewhat difficult      7. Current smoker Smokes over a pack a day- doe snot want to quit. Refuses low dose CT scan and or chest xray.  8. BMI 27.0-27.9,adult No recent weight changes Wt Readings from Last 3 Encounters:  01/03/22 144 lb 12.8 oz (65.7 kg)  07/03/21 146 lb 9.6 oz (66.5 kg)  04/23/21 148 lb (67.1 kg)   BMI Readings from Last 3 Encounters:  01/03/22 25.65 kg/m  07/03/21 25.97 kg/m  04/23/21 26.22 kg/m    New complaints: None today  Allergies  Allergen Reactions   Celexa [Citalopram Hydrobromide] Anaphylaxis, Shortness Of Breath and Swelling   Zocor [Simvastatin]     myalsia   Pravachol [Pravastatin Sodium] Rash  Outpatient Encounter Medications as of 01/03/2022  Medication Sig   amLODipine (NORVASC) 5 MG tablet Take 1 tablet (5 mg total) by mouth daily.   aspirin 81 MG tablet Take 81 mg by mouth daily.   calcium carbonate (OS-CAL) 600 MG TABS Take 600 mg by mouth 2 (two) times daily with a meal.   Cholecalciferol (VITAMIN D-3) 1000 UNITS CAPS Take 1,000 Units by mouth daily.   citalopram (CELEXA) 20 MG tablet Take 1 tablet (20 mg total) by mouth daily.   clonazePAM (KLONOPIN) 0.5 MG tablet TAKE ONE (1) TABLET BY MOUTH TWO (2) TIMES DAILY   fish oil-omega-3 fatty acids 1000 MG capsule Take 2 g by mouth daily.   gabapentin (NEURONTIN) 100 MG capsule Take 1 capsule (100 mg total) by mouth 3 (three) times daily.   hydrochlorothiazide (HYDRODIURIL) 25 MG tablet Take 1 tablet (25 mg total) by mouth daily.   levothyroxine (SYNTHROID) 88 MCG tablet TAKE ONE (1) TABLET BY MOUTH EVERY DAY   lisinopril (ZESTRIL) 40 MG tablet Take 1 tablet (40 mg total) by mouth  daily.   metoprolol succinate (TOPROL-XL) 100 MG 24 hr tablet Take 1 tablet (100 mg total) by mouth daily. with food   Facility-Administered Encounter Medications as of 01/03/2022  Medication   cyanocobalamin ((VITAMIN B-12)) injection 1,000 mcg    Past Surgical History:  Procedure Laterality Date   EYE SURGERY Right 1970   right -UNDER eye lesion removed     Family History  Problem Relation Age of Onset   Hypertension Mother    Heart attack Mother 35   Heart disease Mother    Clotting disorder Father 8   Stroke Brother    Suicidality Sister    Other Sister        train accident   Cancer Sister        liver cancer   Diabetes Brother    Other Brother        storm accident   Diabetes Brother        leg removed    Cancer Brother        lung   Cancer Brother        lymphoma      Controlled substance contract: n/a     Review of Systems  Constitutional:  Negative for diaphoresis.  Eyes:  Negative for pain.  Respiratory:  Negative for shortness of breath.   Cardiovascular:  Negative for chest pain, palpitations and leg swelling.  Gastrointestinal:  Negative for abdominal pain.  Endocrine: Negative for polydipsia.  Skin:  Negative for rash.  Neurological:  Negative for dizziness, weakness and headaches.  Hematological:  Does not bruise/bleed easily.  All other systems reviewed and are negative.      Objective:   Physical Exam Vitals and nursing note reviewed.  Constitutional:      General: She is not in acute distress.    Appearance: Normal appearance. She is well-developed.  HENT:     Head: Normocephalic.     Right Ear: Tympanic membrane normal.     Left Ear: Tympanic membrane normal.     Nose: Nose normal.     Mouth/Throat:     Mouth: Mucous membranes are moist.  Eyes:     Pupils: Pupils are equal, round, and reactive to light.  Neck:     Vascular: No carotid bruit or JVD.  Cardiovascular:     Rate and Rhythm: Normal rate and regular rhythm.      Heart sounds: Normal heart  sounds.  Pulmonary:     Effort: Pulmonary effort is normal. No respiratory distress.     Breath sounds: Normal breath sounds. No wheezing or rales.  Chest:     Chest wall: No tenderness.  Abdominal:     General: Bowel sounds are normal. There is no distension or abdominal bruit.     Palpations: Abdomen is soft. There is no hepatomegaly, splenomegaly, mass or pulsatile mass.     Tenderness: There is no abdominal tenderness.  Musculoskeletal:        General: Normal range of motion.     Cervical back: Normal range of motion and neck supple.  Lymphadenopathy:     Cervical: No cervical adenopathy.  Skin:    General: Skin is warm and dry.  Neurological:     Mental Status: She is alert and oriented to person, place, and time.     Deep Tendon Reflexes: Reflexes are normal and symmetric.  Psychiatric:        Behavior: Behavior normal.        Thought Content: Thought content normal.        Judgment: Judgment normal.    BP 136/82   Pulse 64   Temp (!) 97.5 F (36.4 C) (Temporal)   Ht 5' 3"  (1.6 m)   Wt 144 lb 12.8 oz (65.7 kg)   SpO2 91%   BMI 25.65 kg/m          Assessment & Plan:   Denise Stewart comes in today with chief complaint of Medical Management of Chronic Issues   Diagnosis and orders addressed:  1. Primary hypertension Dash diet - amLODipine (NORVASC) 5 MG tablet; Take 1 tablet (5 mg total) by mouth daily.  Dispense: 90 tablet; Refill: 1 - lisinopril (ZESTRIL) 40 MG tablet; Take 1 tablet (40 mg total) by mouth daily.  Dispense: 90 tablet; Refill: 1 - hydrochlorothiazide (HYDRODIURIL) 25 MG tablet; Take 1 tablet (25 mg total) by mouth daily.  Dispense: 90 tablet; Refill: 1 - metoprolol succinate (TOPROL-XL) 100 MG 24 hr tablet; Take 1 tablet (100 mg total) by mouth daily. with food  Dispense: 90 tablet; Refill: 1 - CBC with Differential/Platelet - CMP14+EGFR  2. Mixed hyperlipidemia Low fat diet - Lipid panel  3. Hypothyroidism,  unspecified type Labs oending - levothyroxine (SYNTHROID) 88 MCG tablet; TAKE ONE (1) TABLET BY MOUTH EVERY DAY  Dispense: 90 tablet; Refill: 1  4. Neuropathy Do not go barefooted - gabapentin (NEURONTIN) 100 MG capsule; Take 1 capsule (100 mg total) by mouth 3 (three) times daily.  Dispense: 90 capsule; Refill: 5  5. Recurrent major depressive disorder, in full remission [facility]) Stress management  6. GAD (generalized anxiety disorder) - ToxASSURE Select 13 (MW), Urine - clonazePAM (KLONOPIN) 0.5 MG tablet; TAKE ONE (1) TABLET BY MOUTH TWO (2) TIMES DAILY  Dispense: 60 tablet; Refill: 5  7. Current smoker Smoking cessation encouraged  8. BMI 27.0-27.9,adult Discussed diet and exercise for person with BMI >25 Will recheck weight in 3-6 months    Labs pending Health Maintenance reviewed Diet and exercise encouraged  Follow up plan: 6 months   Denise Hassell Done, FNP

## 2022-01-04 LAB — LIPID PANEL
Chol/HDL Ratio: 3.4 ratio (ref 0.0–4.4)
Cholesterol, Total: 165 mg/dL (ref 100–199)
HDL: 49 mg/dL (ref >39)
LDL Chol Calc (NIH): 95 mg/dL (ref 0–99)
Triglycerides: 117 mg/dL (ref 0–149)
VLDL Cholesterol Cal: 21 mg/dL (ref 5–40)

## 2022-01-04 LAB — CMP14+EGFR
ALT: 7 IU/L (ref 0–32)
AST: 11 IU/L (ref 0–40)
Albumin/Globulin Ratio: 1.5 (ref 1.2–2.2)
Albumin: 4.4 g/dL (ref 3.7–4.7)
Alkaline Phosphatase: 92 IU/L (ref 44–121)
BUN/Creatinine Ratio: 9 — ABNORMAL LOW (ref 12–28)
BUN: 10 mg/dL (ref 8–27)
Bilirubin Total: 0.6 mg/dL (ref 0.0–1.2)
CO2: 26 mmol/L (ref 20–29)
Calcium: 9.3 mg/dL (ref 8.7–10.3)
Chloride: 100 mmol/L (ref 96–106)
Creatinine, Ser: 1.15 mg/dL — ABNORMAL HIGH (ref 0.57–1.00)
Globulin, Total: 2.9 g/dL (ref 1.5–4.5)
Glucose: 96 mg/dL (ref 70–99)
Potassium: 4.1 mmol/L (ref 3.5–5.2)
Sodium: 141 mmol/L (ref 134–144)
Total Protein: 7.3 g/dL (ref 6.0–8.5)
eGFR: 47 mL/min/{1.73_m2} — ABNORMAL LOW (ref >59)

## 2022-01-04 LAB — CBC WITH DIFFERENTIAL/PLATELET
Basophils Absolute: 0.1 10*3/uL (ref 0.0–0.2)
Basos: 1 %
EOS (ABSOLUTE): 0.1 10*3/uL (ref 0.0–0.4)
Eos: 1 %
Hematocrit: 43.3 % (ref 34.0–46.6)
Hemoglobin: 15 g/dL (ref 11.1–15.9)
Immature Grans (Abs): 0 10*3/uL (ref 0.0–0.1)
Immature Granulocytes: 1 %
Lymphocytes Absolute: 2.2 10*3/uL (ref 0.7–3.1)
Lymphs: 25 %
MCH: 32.2 pg (ref 26.6–33.0)
MCHC: 34.6 g/dL (ref 31.5–35.7)
MCV: 93 fL (ref 79–97)
Monocytes Absolute: 0.7 10*3/uL (ref 0.1–0.9)
Monocytes: 8 %
Neutrophils Absolute: 5.8 10*3/uL (ref 1.4–7.0)
Neutrophils: 64 %
Platelets: 231 10*3/uL (ref 150–450)
RBC: 4.66 x10E6/uL (ref 3.77–5.28)
RDW: 12.3 % (ref 11.7–15.4)
WBC: 8.9 10*3/uL (ref 3.4–10.8)

## 2022-01-05 LAB — TOXASSURE SELECT 13 (MW), URINE

## 2022-01-21 ENCOUNTER — Ambulatory Visit (INDEPENDENT_AMBULATORY_CARE_PROVIDER_SITE_OTHER): Payer: Medicare HMO

## 2022-01-21 DIAGNOSIS — E538 Deficiency of other specified B group vitamins: Secondary | ICD-10-CM

## 2022-01-21 NOTE — Progress Notes (Signed)
Cyanocobalamin injection given to left deltoid.  Patient tolerated well. 

## 2022-02-20 ENCOUNTER — Ambulatory Visit (INDEPENDENT_AMBULATORY_CARE_PROVIDER_SITE_OTHER): Payer: Medicare HMO

## 2022-02-20 DIAGNOSIS — E538 Deficiency of other specified B group vitamins: Secondary | ICD-10-CM

## 2022-02-20 NOTE — Progress Notes (Signed)
Cyanocobalamin injection given to right deltoid.  Patient tolerated well. 

## 2022-03-25 ENCOUNTER — Ambulatory Visit (INDEPENDENT_AMBULATORY_CARE_PROVIDER_SITE_OTHER): Payer: Medicare HMO

## 2022-03-25 DIAGNOSIS — E538 Deficiency of other specified B group vitamins: Secondary | ICD-10-CM | POA: Diagnosis not present

## 2022-04-24 ENCOUNTER — Ambulatory Visit (INDEPENDENT_AMBULATORY_CARE_PROVIDER_SITE_OTHER): Payer: Medicare HMO

## 2022-04-24 VITALS — Ht 63.0 in | Wt 144.0 lb

## 2022-04-24 DIAGNOSIS — Z0000 Encounter for general adult medical examination without abnormal findings: Secondary | ICD-10-CM | POA: Diagnosis not present

## 2022-04-24 DIAGNOSIS — Z780 Asymptomatic menopausal state: Secondary | ICD-10-CM

## 2022-04-24 NOTE — Patient Instructions (Signed)
Denise Stewart , Thank you for taking time to come for your Medicare Wellness Visit. I appreciate your ongoing commitment to your health goals. Please review the following plan we discussed and let me know if I can assist you in the future.   These are the goals we discussed:  Goals      DIET - INCREASE WATER INTAKE     Try to drink 6-8 glasses of water daily     Increase physical activity     Patient Stated     04/20/2020 AWV Goal: Exercise for General Health  Patient will verbalize understanding of the benefits of increased physical activity: Exercising regularly is important. It will improve your overall fitness, flexibility, and endurance. Regular exercise also will improve your overall health. It can help you control your weight, reduce stress, and improve your bone density. Over the next year, patient will increase physical activity as tolerated with a goal of at least 150 minutes of moderate physical activity per week.  You can tell that you are exercising at a moderate intensity if your heart starts beating faster and you start breathing faster but can still hold a conversation. Moderate-intensity exercise ideas include: Walking 1 mile (1.6 km) in about 15 minutes Biking Hiking Golfing Dancing Water aerobics Patient will verbalize understanding of everyday activities that increase physical activity by providing examples like the following: Yard work, such as: Sales promotion account executive Gardening Washing windows or floors Patient will be able to explain general safety guidelines for exercising:  Before you start a new exercise program, talk with your health care provider. Do not exercise so much that you hurt yourself, feel dizzy, or get very short of breath. Wear comfortable clothes and wear shoes with good support. Drink plenty of water while you exercise to prevent dehydration or heat stroke. Work out  until your breathing and your heartbeat get faster.      Prevent falls     Wants to clean house        This is a list of the screening recommended for you and due dates:  Health Maintenance  Topic Date Due   COVID-19 Vaccine (1) Never done   DTaP/Tdap/Td vaccine (1 - Tdap) Never done   Zoster (Shingles) Vaccine (1 of 2) Never done   Mammogram  07/04/2022*   DEXA scan (bone density measurement)  07/04/2022*   Flu Shot  07/07/2022*   Medicare Annual Wellness Visit  04/25/2023   Pneumonia Vaccine  Completed   HPV Vaccine  Aged Out  *Topic was postponed. The date shown is not the original due date.    Advanced directives: Please bring a copy of your health care power of attorney and living will to the office to be added to your chart at your convenience.   Conditions/risks identified: Aim for 30 minutes of exercise or brisk walking, 6-8 glasses of water, and 5 servings of fruits and vegetables each day.   Next appointment: Follow up in one year for your annual wellness visit    Preventive Care 65 Years and Older, Female Preventive care refers to lifestyle choices and visits with your health care provider that can promote health and wellness. What does preventive care include? A yearly physical exam. This is also called an annual well check. Dental exams once or twice a year. Routine eye exams. Ask your health care provider how often you should have your eyes checked. Personal lifestyle  choices, including: Daily care of your teeth and gums. Regular physical activity. Eating a healthy diet. Avoiding tobacco and drug use. Limiting alcohol use. Practicing safe sex. Taking low-dose aspirin every day. Taking vitamin and mineral supplements as recommended by your health care provider. What happens during an annual well check? The services and screenings done by your health care provider during your annual well check will depend on your age, overall health, lifestyle risk factors,  and family history of disease. Counseling  Your health care provider may ask you questions about your: Alcohol use. Tobacco use. Drug use. Emotional well-being. Home and relationship well-being. Sexual activity. Eating habits. History of falls. Memory and ability to understand (cognition). Work and work Astronomer. Reproductive health. Screening  You may have the following tests or measurements: Height, weight, and BMI. Blood pressure. Lipid and cholesterol levels. These may be checked every 5 years, or more frequently if you are over 58 years old. Skin check. Lung cancer screening. You may have this screening every year starting at age 43 if you have a 30-pack-year history of smoking and currently smoke or have quit within the past 15 years. Fecal occult blood test (FOBT) of the stool. You may have this test every year starting at age 35. Flexible sigmoidoscopy or colonoscopy. You may have a sigmoidoscopy every 5 years or a colonoscopy every 10 years starting at age 40. Hepatitis C blood test. Hepatitis B blood test. Sexually transmitted disease (STD) testing. Diabetes screening. This is done by checking your blood sugar (glucose) after you have not eaten for a while (fasting). You may have this done every 1-3 years. Bone density scan. This is done to screen for osteoporosis. You may have this done starting at age 41. Mammogram. This may be done every 1-2 years. Talk to your health care provider about how often you should have regular mammograms. Talk with your health care provider about your test results, treatment options, and if necessary, the need for more tests. Vaccines  Your health care provider may recommend certain vaccines, such as: Influenza vaccine. This is recommended every year. Tetanus, diphtheria, and acellular pertussis (Tdap, Td) vaccine. You may need a Td booster every 10 years. Zoster vaccine. You may need this after age 83. Pneumococcal 13-valent conjugate  (PCV13) vaccine. One dose is recommended after age 59. Pneumococcal polysaccharide (PPSV23) vaccine. One dose is recommended after age 19. Talk to your health care provider about which screenings and vaccines you need and how often you need them. This information is not intended to replace advice given to you by your health care provider. Make sure you discuss any questions you have with your health care provider. Document Released: 04/21/2015 Document Revised: 12/13/2015 Document Reviewed: 01/24/2015 Elsevier Interactive Patient Education  2017 [employer].  Fall Prevention in the Home Falls can cause injuries. They can happen to people of all ages. There are many things you can do to make your home safe and to help prevent falls. What can I do on the outside of my home? Regularly fix the edges of walkways and driveways and fix any cracks. Remove anything that might make you trip as you walk through a door, such as a raised step or threshold. Trim any bushes or trees on the path to your home. Use bright outdoor lighting. Clear any walking paths of anything that might make someone trip, such as rocks or tools. Regularly check to see if handrails are loose or broken. Make sure that both sides of any steps  have handrails. Any raised decks and porches should have guardrails on the edges. Have any leaves, snow, or ice cleared regularly. Use sand or salt on walking paths during winter. Clean up any spills in your garage right away. This includes oil or grease spills. What can I do in the bathroom? Use night lights. Install grab bars by the toilet and in the tub and shower. Do not use towel bars as grab bars. Use non-skid mats or decals in the tub or shower. If you need to sit down in the shower, use a plastic, non-slip stool. Keep the floor dry. Clean up any water that spills on the floor as soon as it happens. Remove soap buildup in the tub or shower regularly. Attach bath mats securely with  double-sided non-slip rug tape. Do not have throw rugs and other things on the floor that can make you trip. What can I do in the bedroom? Use night lights. Make sure that you have a light by your bed that is easy to reach. Do not use any sheets or blankets that are too big for your bed. They should not hang down onto the floor. Have a firm chair that has side arms. You can use this for support while you get dressed. Do not have throw rugs and other things on the floor that can make you trip. What can I do in the kitchen? Clean up any spills right away. Avoid walking on wet floors. Keep items that you use a lot in easy-to-reach places. If you need to reach something above you, use a strong step stool that has a grab bar. Keep electrical cords out of the way. Do not use floor polish or wax that makes floors slippery. If you must use wax, use non-skid floor wax. Do not have throw rugs and other things on the floor that can make you trip. What can I do with my stairs? Do not leave any items on the stairs. Make sure that there are handrails on both sides of the stairs and use them. Fix handrails that are broken or loose. Make sure that handrails are as long as the stairways. Check any carpeting to make sure that it is firmly attached to the stairs. Fix any carpet that is loose or worn. Avoid having throw rugs at the top or bottom of the stairs. If you do have throw rugs, attach them to the floor with carpet tape. Make sure that you have a light switch at the top of the stairs and the bottom of the stairs. If you do not have them, ask someone to add them for you. What else can I do to help prevent falls? Wear shoes that: Do not have high heels. Have rubber bottoms. Are comfortable and fit you well. Are closed at the toe. Do not wear sandals. If you use a stepladder: Make sure that it is fully opened. Do not climb a closed stepladder. Make sure that both sides of the stepladder are locked  into place. Ask someone to hold it for you, if possible. Clearly mark and make sure that you can see: Any grab bars or handrails. First and last steps. Where the edge of each step is. Use tools that help you move around (mobility aids) if they are needed. These include: Canes. Walkers. Scooters. Crutches. Turn on the lights when you go into a dark area. Replace any light bulbs as soon as they burn out. Set up your furniture so you have a clear path.  Avoid moving your furniture around. If any of your floors are uneven, fix them. If there are any pets around you, be aware of where they are. Review your medicines with your doctor. Some medicines can make you feel dizzy. This can increase your chance of falling. Ask your doctor what other things that you can do to help prevent falls. This information is not intended to replace advice given to you by your health care provider. Make sure you discuss any questions you have with your health care provider. Document Released: 01/19/2009 Document Revised: 08/31/2015 Document Reviewed: 04/29/2014 Elsevier Interactive Patient Education  2017 [employer].

## 2022-04-24 NOTE — Progress Notes (Signed)
Subjective:   Denise Stewart is a 82 y.o. female who presents for Medicare Annual (Subsequent) preventive examination. I connected with  Pegeen Stiger on 04/24/22 by a audio enabled telemedicine application and verified that I am speaking with the correct person using two identifiers.  Patient Location: Home  Provider Location: Home Office  I discussed the limitations of evaluation and management by telemedicine. The patient expressed understanding and agreed to proceed.  Review of Systems     Cardiac Risk Factors include: advanced age (>64men, >35 women);hypertension     Objective:    Today's Vitals   04/24/22 1120  Weight: 144 lb (65.3 kg)  Height: 5\' 3"  (1.6 m)   Body mass index is 25.51 kg/m.     04/24/2022   11:25 AM 04/23/2021   11:08 AM 04/20/2020    1:20 PM 04/20/2019    1:27 PM 05/13/2017    2:20 PM 03/18/2014    2:58 PM  Advanced Directives  Does Patient Have a Medical Advance Directive? Yes Yes Yes Yes No No  Type of 14/02/2014 of [place];Living will Healthcare Power of [place] Power of [employer] Power of [place];Living will    Does patient want to make changes to medical advance directive?   No - Patient declined No - Patient declined    Copy of Healthcare Power of Attorney in Chart? No - copy requested No - copy requested No - copy requested No - copy requested    Would patient like information on creating a medical advance directive?     Yes (MAU/Ambulatory/Procedural Areas - Information given) Yes - Educational materials given    Current Medications (verified) Outpatient Encounter Medications as of 04/24/2022  Medication Sig   amLODipine (NORVASC) 5 MG tablet Take 1 tablet (5 mg total) by mouth daily.   aspirin 81 MG tablet Take 81 mg by mouth daily.   calcium carbonate (OS-CAL) 600 MG TABS Take 600 mg by mouth 2 (two) times daily with a meal.   Cholecalciferol (VITAMIN D-3) 1000 UNITS CAPS Take 1,000 Units by  mouth daily.   citalopram (CELEXA) 20 MG tablet Take 1 tablet (20 mg total) by mouth daily.   clonazePAM (KLONOPIN) 0.5 MG tablet TAKE ONE (1) TABLET BY MOUTH TWO (2) TIMES DAILY   fish oil-omega-3 fatty acids 1000 MG capsule Take 2 g by mouth daily.   gabapentin (NEURONTIN) 100 MG capsule Take 1 capsule (100 mg total) by mouth 3 (three) times daily.   hydrochlorothiazide (HYDRODIURIL) 25 MG tablet Take 1 tablet (25 mg total) by mouth daily.   levothyroxine (SYNTHROID) 88 MCG tablet TAKE ONE (1) TABLET BY MOUTH EVERY DAY   lisinopril (ZESTRIL) 40 MG tablet Take 1 tablet (40 mg total) by mouth daily.   metoprolol succinate (TOPROL-XL) 100 MG 24 hr tablet Take 1 tablet (100 mg total) by mouth daily. with food   No facility-administered encounter medications on file as of 04/24/2022.    Allergies (verified) Celexa [citalopram hydrobromide], Zocor [simvastatin], and Pravachol [pravastatin sodium]   History: Past Medical History:  Diagnosis Date   Depression    GAD (generalized anxiety disorder)    Hyperlipidemia    Hypertension    Thyroid disease    Past Surgical History:  Procedure Laterality Date   EYE SURGERY Right 1970   right -UNDER eye lesion removed    Family History  Problem Relation Age of Onset   Hypertension Mother    Heart attack Mother 82   Heart disease  Mother    Clotting disorder Father 39   Stroke Brother    Suicidality Sister    Other Sister        train accident   Cancer Sister        liver cancer   Diabetes Brother    Other Brother        storm accident   Diabetes Brother        leg removed    Cancer Brother        lung   Cancer Brother        lymphoma   Social History   Socioeconomic History   Marital status: Widowed    Spouse name: Not on file   Number of children: 2   Years of education: 10   Highest education level: 10th grade  Occupational History   Occupation: retired    Fish farm manager: [employer]  Tobacco Use   Smoking status:  Every Day    Packs/day: 0.50    Years: 64.00    Total pack years: 32.00    Types: Cigarettes    Start date: 04/08/1953   Smokeless tobacco: Never  Vaping Use   Vaping Use: Never used  Substance and Sexual Activity   Alcohol use: No   Drug use: No   Sexual activity: Not Currently    Birth control/protection: Post-menopausal  Other Topics Concern   Not on file  Social History Narrative   Lives alone on one level -   Daughter and son call and visit several times daily   Social Determinants of Health   Financial Resource Strain: Low Risk  (04/24/2022)   Overall Financial Resource Strain (CARDIA)    Difficulty of Paying Living Expenses: Not hard at all  Food Insecurity: No Food Insecurity (04/24/2022)   Hunger Vital Sign    Worried About Running Out of Food in the Last Year: Never true    [place] in the Last Year: Never true  Transportation Needs: No Transportation Needs (04/24/2022)   PRAPARE - Hydrologist (Medical): No    Lack of Transportation (Non-Medical): No  Physical Activity: Insufficiently Active (04/24/2022)   Exercise Vital Sign    Days of Exercise per Week: 3 days    Minutes of Exercise per Session: 30 min  Stress: No Stress Concern Present (04/24/2022)   [place]    Feeling of Stress : Not at all  Social Connections: Moderately Isolated (04/24/2022)   Social Connection and Isolation Panel [NHANES]    Frequency of Communication with Friends and Family: More than three times a week    Frequency of Social Gatherings with Friends and Family: More than three times a week    Attends Religious Services: 1 to 4 times per year    Active Member of [employer] or Organizations: No    Attends Archivist Meetings: Never    Marital Status: Widowed    Tobacco Counseling Ready to quit: Not Answered Counseling given: Not Answered   Clinical Intake:  Pre-visit  preparation completed: Yes  Pain : No/denies pain     Nutritional Risks: None Diabetes: No  How often do you need to have someone help you when you read instructions, pamphlets, or other written materials from your doctor or pharmacy?: 1 - Never  Diabetic?no   Interpreter Needed?: No  Information entered by :: Jadene Pierini, LPN   Activities of Daily Living    04/24/2022  11:25 AM  In your present state of health, do you have any difficulty performing the following activities:  Hearing? 0  Vision? 0  Difficulty concentrating or making decisions? 0  Walking or climbing stairs? 0  Dressing or bathing? 0  Doing errands, shopping? 0  Preparing Food and eating ? N  Using the Toilet? N  In the past six months, have you accidently leaked urine? N  Do you have problems with loss of bowel control? N  Managing your Medications? N  Managing your Finances? N  Housekeeping or managing your Housekeeping? N    Patient Care Team: Bennie Pierini, FNP as PCP - General (Nurse Practitioner)  Indicate any recent Medical Services you may have received from other than [facility] providers in the past year (date may be approximate).     Assessment:   This is a routine wellness examination for [place] [employer].  Hearing/Vision screen Vision Screening - Comments:: Wears rx glasses - up to date with routine eye exams with  Dr.Lee  Dietary issues and exercise activities discussed: Current Exercise Habits: Home exercise routine, Type of exercise: walking, Time (Minutes): 30, Frequency (Times/Week): 3, Weekly Exercise (Minutes/Week): 90, Exercise limited by: None identified   Goals Addressed             This Visit's Progress    DIET - INCREASE WATER INTAKE   On track    Try to drink 6-8 glasses of water daily       Depression Screen    04/24/2022   11:23 AM 01/03/2022    3:40 PM 01/03/2022    3:29 PM 07/03/2021    2:39 PM 04/23/2021   11:13 AM 01/05/2021    2:37 PM 07/04/2020    4:03 PM   PHQ 2/9 Scores  PHQ - 2 Score 0 2 2  0 0 0  PHQ- 9 Score  3 3   0 2  Exception Documentation    Patient refusal       Fall Risk    04/24/2022   11:21 AM 01/03/2022    3:40 PM 07/03/2021    2:39 PM 04/23/2021   11:14 AM 01/05/2021    2:37 PM  Fall Risk   Falls in the past year? 0 0 1 1 0  Number falls in past yr: 0  0 0   Injury with Fall? 0  0 0   Risk for fall due to : No Fall Risks  History of fall(s) History of fall(s);Medication side effect;Other (Comment)   Risk for fall due to: Comment    neuropathy   Follow up Falls prevention discussed  Falls evaluation completed Education provided;Falls prevention discussed     FALL RISK PREVENTION PERTAINING TO THE HOME:  Any stairs in or around the home? No  If so, are there any without handrails? No  Home free of loose throw rugs in walkways, pet beds, electrical cords, etc? Yes  Adequate lighting in your home to reduce risk of falls? Yes   ASSISTIVE DEVICES UTILIZED TO PREVENT FALLS:  Life alert? No  Use of a cane, walker or w/c? Yes  Grab bars in the bathroom? No  Shower chair or bench in shower? Yes  Elevated toilet seat or a handicapped toilet? No       05/13/2017    2:26 PM  MMSE - Mini Mental State Exam  Orientation to time 5  Orientation to Place 5  Registration 3  Attention/ Calculation 5  Recall 3  Language- name 2 objects  2  Language- repeat 1  Language- follow 3 step command 2  Language- read & follow direction 1  Write a sentence 1  Copy design 1  Total score 29        04/24/2022   11:25 AM 04/20/2020    1:23 PM 04/20/2019    1:34 PM  6CIT Screen  What Year? 0 points 0 points 0 points  What month? 0 points 0 points 0 points  What time? 0 points 0 points 0 points  Count back from 20 0 points 0 points 0 points  Months in reverse 0 points 0 points 0 points  Repeat phrase 0 points 0 points 0 points  Total Score 0 points 0 points 0 points    Immunizations Immunization History  Administered Date(s)  Administered   Pneumococcal Conjugate-13 08/29/2014   Pneumococcal Polysaccharide-23 10/20/2015    TDAP status: Due, Education has been provided regarding the importance of this vaccine. Advised may receive this vaccine at local pharmacy or Health Dept. Aware to provide a copy of the vaccination record if obtained from local pharmacy or Health Dept. Verbalized acceptance and understanding.  Flu Vaccine status: Declined, Education has been provided regarding the importance of this vaccine but patient still declined. Advised may receive this vaccine at local pharmacy or Health Dept. Aware to provide a copy of the vaccination record if obtained from local pharmacy or Health Dept. Verbalized acceptance and understanding.  Pneumococcal vaccine status: Up to date  Covid-19 vaccine status: Completed vaccines  Qualifies for Shingles Vaccine? Yes   Zostavax completed No   Shingrix Completed?: No.    Education has been provided regarding the importance of this vaccine. Patient has been advised to call insurance company to determine out of pocket expense if they have not yet received this vaccine. Advised may also receive vaccine at local pharmacy or Health Dept. Verbalized acceptance and understanding.  Screening Tests Health Maintenance  Topic Date Due   COVID-19 Vaccine (1) Never done   DTaP/Tdap/Td (1 - Tdap) Never done   Zoster Vaccines- Shingrix (1 of 2) Never done   MAMMOGRAM  07/04/2022 (Originally 09/19/1958)   DEXA SCAN  07/04/2022 (Originally 06/24/2013)   INFLUENZA VACCINE  07/07/2022 (Originally 11/06/2021)   Medicare Annual Wellness (AWV)  04/25/2023   Pneumonia Vaccine 54+ Years old  Completed   HPV VACCINES  Aged Out    Health Maintenance  Health Maintenance Due  Topic Date Due   COVID-19 Vaccine (1) Never done   DTaP/Tdap/Td (1 - Tdap) Never done   Zoster Vaccines- Shingrix (1 of 2) Never done    Colorectal cancer screening: No longer required.   Mammogram status: No  longer required due to age .  Bone Density status: Ordered 04/24/2022. Pt provided with contact info and advised to call to schedule appt.  Lung Cancer Screening: (Low Dose CT Chest recommended if Age 36-80 years, 30 pack-year currently smoking OR have quit w/in 15years.) does qualify.   Lung Cancer Screening Referral: Declined   Additional Screening:  Hepatitis C Screening: does not qualify;   Vision Screening: Recommended annual ophthalmology exams for early detection of glaucoma and other disorders of the eye. Is the patient up to date with their annual eye exam?  Yes  Who is the provider or what is the name of the office in which the patient attends annual eye exams? Dr.Lee  If pt is not established with a provider, would they like to be referred to a provider to establish care? No .  Dental Screening: Recommended annual dental exams for proper oral hygiene  Community Resource Referral / Chronic Care Management: CRR required this visit?  No   CCM required this visit?  No      Plan:     I have personally reviewed and noted the following in the patient's chart:   Medical and social history Use of alcohol, tobacco or illicit drugs  Current medications and supplements including opioid prescriptions. Patient is not currently taking opioid prescriptions. Functional ability and status Nutritional status Physical activity Advanced directives List of other physicians Hospitalizations, surgeries, and ER visits in previous 12 months Vitals Screenings to include cognitive, depression, and falls Referrals and appointments  In addition, I have reviewed and discussed with patient certain preventive protocols, quality metrics, and best practice recommendations. A written personalized care plan for preventive services as well as general preventive health recommendations were provided to patient.     Daphane Shepherd, LPN   [id number]   Nurse Notes: Due Dexa Scan Jolyn Nap TDAP Vaccine

## 2022-04-26 ENCOUNTER — Ambulatory Visit: Payer: Medicare HMO

## 2022-05-15 ENCOUNTER — Other Ambulatory Visit: Payer: Self-pay

## 2022-05-15 DIAGNOSIS — Z780 Asymptomatic menopausal state: Secondary | ICD-10-CM

## 2022-05-17 ENCOUNTER — Ambulatory Visit (INDEPENDENT_AMBULATORY_CARE_PROVIDER_SITE_OTHER): Payer: Medicare HMO

## 2022-05-17 DIAGNOSIS — E538 Deficiency of other specified B group vitamins: Secondary | ICD-10-CM

## 2022-05-17 MED ORDER — CYANOCOBALAMIN 1000 MCG/ML IJ SOLN
1000.0000 ug | INTRAMUSCULAR | Status: AC
  Administered 2022-05-17 – 2023-05-02 (×12): 1000 ug via INTRAMUSCULAR

## 2022-05-17 NOTE — Progress Notes (Signed)
Cyanocobalamin injection given to right deltoid.  Patient tolerated well. 

## 2022-06-17 ENCOUNTER — Ambulatory Visit (INDEPENDENT_AMBULATORY_CARE_PROVIDER_SITE_OTHER): Payer: Medicare HMO

## 2022-06-17 DIAGNOSIS — E538 Deficiency of other specified B group vitamins: Secondary | ICD-10-CM | POA: Diagnosis not present

## 2022-06-17 NOTE — Progress Notes (Signed)
Cyanocobalamin injection given to left deltoid.  Patient tolerated well. 

## 2022-06-28 ENCOUNTER — Other Ambulatory Visit: Payer: Self-pay | Admitting: Nurse Practitioner

## 2022-06-28 DIAGNOSIS — I10 Essential (primary) hypertension: Secondary | ICD-10-CM

## 2022-06-28 DIAGNOSIS — E039 Hypothyroidism, unspecified: Secondary | ICD-10-CM

## 2022-07-08 ENCOUNTER — Encounter: Payer: Self-pay | Admitting: Nurse Practitioner

## 2022-07-08 ENCOUNTER — Ambulatory Visit (INDEPENDENT_AMBULATORY_CARE_PROVIDER_SITE_OTHER): Payer: Medicare HMO | Admitting: Nurse Practitioner

## 2022-07-08 ENCOUNTER — Ambulatory Visit: Payer: Medicare HMO

## 2022-07-08 VITALS — BP 147/81 | HR 67 | Temp 97.7°F | Resp 20 | Ht 63.0 in | Wt 148.0 lb

## 2022-07-08 DIAGNOSIS — E039 Hypothyroidism, unspecified: Secondary | ICD-10-CM

## 2022-07-08 DIAGNOSIS — Z6826 Body mass index (BMI) 26.0-26.9, adult: Secondary | ICD-10-CM | POA: Diagnosis not present

## 2022-07-08 DIAGNOSIS — F411 Generalized anxiety disorder: Secondary | ICD-10-CM

## 2022-07-08 DIAGNOSIS — G629 Polyneuropathy, unspecified: Secondary | ICD-10-CM

## 2022-07-08 DIAGNOSIS — F3342 Major depressive disorder, recurrent, in full remission: Secondary | ICD-10-CM

## 2022-07-08 DIAGNOSIS — E782 Mixed hyperlipidemia: Secondary | ICD-10-CM

## 2022-07-08 DIAGNOSIS — I10 Essential (primary) hypertension: Secondary | ICD-10-CM | POA: Diagnosis not present

## 2022-07-08 DIAGNOSIS — F17200 Nicotine dependence, unspecified, uncomplicated: Secondary | ICD-10-CM

## 2022-07-08 MED ORDER — HYDROCHLOROTHIAZIDE 25 MG PO TABS
25.0000 mg | ORAL_TABLET | Freq: Every day | ORAL | 1 refills | Status: DC
Start: 2022-07-08 — End: 2023-01-13

## 2022-07-08 MED ORDER — LEVOTHYROXINE SODIUM 88 MCG PO TABS
88.0000 ug | ORAL_TABLET | Freq: Every day | ORAL | 1 refills | Status: DC
Start: 2022-07-08 — End: 2023-01-13

## 2022-07-08 MED ORDER — CLONAZEPAM 0.5 MG PO TABS: ORAL_TABLET | ORAL | 5 refills | Status: DC

## 2022-07-08 MED ORDER — CITALOPRAM HYDROBROMIDE 20 MG PO TABS
20.0000 mg | ORAL_TABLET | Freq: Every day | ORAL | 1 refills | Status: DC
Start: 2022-07-08 — End: 2022-12-31

## 2022-07-08 MED ORDER — LISINOPRIL 40 MG PO TABS: 40.0000 mg | ORAL_TABLET | Freq: Every day | ORAL | 1 refills | Status: DC

## 2022-07-08 MED ORDER — AMLODIPINE BESYLATE 5 MG PO TABS
5.0000 mg | ORAL_TABLET | Freq: Every day | ORAL | 1 refills | Status: DC
Start: 2022-07-08 — End: 2023-01-13

## 2022-07-08 MED ORDER — METOPROLOL SUCCINATE ER 100 MG PO TB24: 100.0000 mg | ORAL_TABLET | Freq: Every day | ORAL | 1 refills | Status: DC

## 2022-07-08 MED ORDER — GABAPENTIN 100 MG PO CAPS
100.0000 mg | ORAL_CAPSULE | Freq: Three times a day (TID) | ORAL | 5 refills | Status: DC
Start: 2022-07-08 — End: 2023-01-13

## 2022-07-08 NOTE — Patient Instructions (Signed)
Neuropathic Pain Neuropathic pain is pain caused by damage to the nerves that are responsible for certain sensations in your body (sensory nerves). Neuropathic pain can make you more sensitive to pain. Even a minor sensation can feel very painful. This is usually a long-term (chronic) condition that can be difficult to treat. The type of pain differs from person to person. It may: Start suddenly (acute), or it may develop slowly and become chronic. Come and go as damaged nerves heal, or it may stay at the same level for years. Cause emotional distress, loss of sleep, and a lower quality of life. What are the causes? The most common cause of this condition is diabetes. Many other diseases and conditions can also cause neuropathic pain. Causes of neuropathic pain can be classified as: Toxic. This is caused by medicines and chemicals. The most common causes of toxic neuropathic pain is damage from medicines that kill cancer cells (chemotherapy) or alcohol abuse. Metabolic. This can be caused by: Diabetes. Lack of vitamins like B12. Traumatic. Any injury that cuts, crushes, or stretches a nerve can cause damage and pain. Compression-related. If a sensory nerve gets trapped or compressed for a long period of time, the blood supply to the nerve can be cut off. Vascular. Many blood vessel diseases can cause neuropathic pain by decreasing blood supply and oxygen to nerves. Autoimmune. This type of pain results from diseases in which the body's defense system (immune system) mistakenly attacks sensory nerves. Examples of autoimmune diseases that can cause neuropathic pain include lupus and multiple sclerosis. Infectious. Many types of viral infections can damage sensory nerves and cause pain. Shingles infection is a common cause of this type of pain. Inherited. Neuropathic pain can be a symptom of many diseases that are passed down through families (genetic). What increases the risk? You are more likely to  develop this condition if: You have diabetes. You smoke. You drink too much alcohol. You are taking certain medicines, including chemotherapy or medicines that treat immune system disorders. What are the signs or symptoms? The main symptom is pain. Neuropathic pain is often described as: Burning. Shock-like. Stinging. Hot or cold. Itching. How is this diagnosed? No single test can diagnose neuropathic pain. It is diagnosed based on: A physical exam and your symptoms. Your health care provider will ask you about your pain. You may be asked to use a pain scale to describe how bad your pain is. Tests. These may be done to see if you have a cause and location of any nerve damage. They include: Nerve conduction studies and electromyography to test how well nerve signals travel through your nerves and muscles (electrodiagnostic testing). Skin biopsy to evaluate for small fiber neuropathy. Imaging studies, such as: X-rays. CT scan. MRI. How is this treated? Treatment for neuropathic pain may change over time. You may need to try different treatment options or a combination of treatments. Some options include: Treating the underlying cause of the neuropathy, such as diabetes, kidney disease, or vitamin deficiencies. Stopping medicines that can cause neuropathy, such as chemotherapy. Medicine to relieve pain. Medicines may include: Prescription or over-the-counter pain medicine. Anti-seizure medicine. Antidepressant medicines. Pain-relieving patches or creams that are applied to painful areas of skin. A medicine to numb the area (local anesthetic), which can be injected as a nerve block. Transcutaneous nerve stimulation. This uses electrical currents to block painful nerve signals. The treatment is painless. Alternative treatments, such as: Acupuncture. Meditation. Massage. Occupational or physical therapy. Pain management programs. Counseling. Follow   these instructions at  home: Medicines  Take over-the-counter and prescription medicines only as told by your health care provider. Ask your health care provider if the medicine prescribed to you: Requires you to avoid driving or using machinery. Can cause constipation. You may need to take these actions to prevent or treat constipation: Drink enough fluid to keep your urine pale yellow. Take over-the-counter or prescription medicines. Eat foods that are high in fiber, such as beans, whole grains, and fresh fruits and vegetables. Limit foods that are high in fat and processed sugars, such as fried or sweet foods. Lifestyle  Have a good support system at home. Consider joining a chronic pain support group. Do not use any products that contain nicotine or tobacco. These products include cigarettes, chewing tobacco, and vaping devices, such as e-cigarettes. If you need help quitting, ask your health care provider. Do not drink alcohol. General instructions Learn as much as you can about your condition. Work closely with all your health care providers to find the treatment plan that works best for you. Ask your health care provider what activities are safe for you. Keep all follow-up visits. This is important. Contact a health care provider if: Your pain treatments are not working. You are having side effects from your medicines. You are struggling with tiredness (fatigue), mood changes, depression, or anxiety. Get help right away if: You have thoughts of hurting yourself. Get help right away if you feel like you may hurt yourself or others, or have thoughts about taking your own life. Go to your nearest emergency room or: Call 911. Call the National Suicide Prevention Lifeline at 1-800-273-8255 or 988. This is open 24 hours a day. Text the Crisis Text Line at 741741. Summary Neuropathic pain is pain caused by damage to the nerves that are responsible for certain sensations in your body (sensory  nerves). Neuropathic pain may come and go as damaged nerves heal, or it may stay at the same level for years. Neuropathic pain is usually a long-term condition that can be difficult to treat. Consider joining a chronic pain support group. This information is not intended to replace advice given to you by your health care provider. Make sure you discuss any questions you have with your health care provider. Document Revised: 11/20/2020 Document Reviewed: 11/20/2020 Elsevier Patient Education  2023 Elsevier Inc.  

## 2022-07-08 NOTE — Progress Notes (Signed)
Subjective:    Patient ID: Denise Stewart, female    DOB: 26-Jun-1940, 82 y.o.   MRN: [id number]   Chief Complaint: Medical Management of Chronic Issues    HPI:  Denise Stewart is a 82 y.o. who identifies as a female who was assigned female at birth.   Social history: Lives with: by herself- her family checks on her daily Work history: retired   Scientist, forensic in today for follow up of the following chronic medical issues:  1. Primary hypertension No c/o chest pain, sob or headache. Does not check blood pressure at home. BP Readings from Last 3 Encounters:  07/08/22 (!) 147/81  01/03/22 136/82  07/03/21 120/66     2. Mixed hyperlipidemia Does not really watch diet and does no exercise. Lab Results  Component Value Date   CHOL 165 01/03/2022   HDL 49 01/03/2022   LDLCALC 95 01/03/2022   TRIG 117 01/03/2022   CHOLHDL 3.4 01/03/2022     3. Acquired hypothyroidism No issues that she is aware of. Lab Results  Component Value Date   TSH 0.463 01/05/2021     4. Neuropathy Has burning in bil feet. Is on neurontin which helps  5. GAD (generalized anxiety disorder) Has klonopin to take as needed. Takes bid.    07/08/2022    2:10 PM 01/03/2022    3:40 PM 01/03/2022    3:30 PM 01/05/2021    2:37 PM  GAD 7 : Generalized Anxiety Score  Nervous, Anxious, on Edge 1 1 1  0  Control/stop worrying 1 0 0 0  Worry too much - different things 1 0 0 0  Trouble relaxing 1 0 0 0  Restless 1 0 0 0  Easily annoyed or irritable 1 0 0 0  Afraid - awful might happen 1 0 0 0  Total GAD 7 Score 7 1 1  0  Anxiety Difficulty Not difficult at all Not difficult at all Not difficult at all Not difficult at all        6. Recurrent major depressive disorder, in full remission Suppose to be on celexa which was not filled at last visit , so patient thought she wasn't suppose to take anymore. Is a worrier about everything and that has gotten worse since she has not had her  celexa.    07/08/2022     2:09 PM 04/24/2022   11:23 AM 01/03/2022    3:40 PM  Depression screen PHQ 2/9  Decreased Interest 1 0 1  Down, Depressed, Hopeless 0 0 1  PHQ - 2 Score 1 0 2  Altered sleeping 0  0  Tired, decreased energy 1  1  Change in appetite 0  0  Feeling bad or failure about yourself  0  0  Trouble concentrating 1  0  Moving slowly or fidgety/restless 0  0  Suicidal thoughts 0  0  PHQ-9 Score 3  3  Difficult doing work/chores Not difficult at all  Not difficult at all      7. Current smoker Smokes about a pack a day- refuses low dose CT scan  8. BMI 25.0-25.9,adult No recent weight changes Wt Readings from Last 3 Encounters:  07/08/22 148 lb (67.1 kg)  04/24/22 144 lb (65.3 kg)  01/03/22 144 lb 12.8 oz (65.7 kg)   BMI Readings from Last 3 Encounters:  07/08/22 26.22 kg/m  04/24/22 25.51 kg/m  01/03/22 25.65 kg/m      New complaints: None today  Allergies  Allergen Reactions  Celexa [Citalopram Hydrobromide] Anaphylaxis, Shortness Of Breath and Swelling   Zocor [Simvastatin]     myalsia   Pravachol [Pravastatin Sodium] Rash   Outpatient Encounter Medications as of 07/08/2022  Medication Sig   amLODipine (NORVASC) 5 MG tablet TAKE ONE (1) TABLET BY MOUTH EVERY DAY   aspirin 81 MG tablet Take 81 mg by mouth daily.   calcium carbonate (OS-CAL) 600 MG TABS Take 600 mg by mouth 2 (two) times daily with a meal.   Cholecalciferol (VITAMIN D-3) 1000 UNITS CAPS Take 1,000 Units by mouth daily.   clonazePAM (KLONOPIN) 0.5 MG tablet TAKE ONE (1) TABLET BY MOUTH TWO (2) TIMES DAILY   fish oil-omega-3 fatty acids 1000 MG capsule Take 2 g by mouth daily.   gabapentin (NEURONTIN) 100 MG capsule Take 1 capsule (100 mg total) by mouth 3 (three) times daily.   hydrochlorothiazide (HYDRODIURIL) 25 MG tablet TAKE ONE (1) TABLET BY MOUTH EVERY DAY   levothyroxine (SYNTHROID) 88 MCG tablet TAKE ONE (1) TABLET BY MOUTH EVERY DAY   lisinopril (ZESTRIL) 40 MG tablet Take 1 tablet (40 mg  total) by mouth daily.   metoprolol succinate (TOPROL-XL) 100 MG 24 hr tablet Take 1 tablet (100 mg total) by mouth daily. with food   citalopram (CELEXA) 20 MG tablet Take 1 tablet (20 mg total) by mouth daily. (Patient not taking: Reported on 07/08/2022)   Facility-Administered Encounter Medications as of 07/08/2022  Medication   cyanocobalamin (VITAMIN B12) injection 1,000 mcg    Past Surgical History:  Procedure Laterality Date   EYE SURGERY Right 1970   right -UNDER eye lesion removed     Family History  Problem Relation Age of Onset   Hypertension Mother    Heart attack Mother 15   Heart disease Mother    Clotting disorder Father 15   Stroke Brother    Suicidality Sister    Other Sister        train accident   Cancer Sister        liver cancer   Diabetes Brother    Other Brother        storm accident   Diabetes Brother        leg removed    Cancer Brother        lung   Cancer Brother        lymphoma      Controlled substance contract: 01/08/22     Review of Systems  Constitutional:  Negative for diaphoresis.  Eyes:  Negative for pain.  Respiratory:  Negative for shortness of breath.   Cardiovascular:  Negative for chest pain, palpitations and leg swelling.  Gastrointestinal:  Negative for abdominal pain.  Endocrine: Negative for polydipsia.  Skin:  Negative for rash.  Neurological:  Negative for dizziness, weakness and headaches.  Hematological:  Does not bruise/bleed easily.  All other systems reviewed and are negative.      Objective:   Physical Exam Vitals and nursing note reviewed.  Constitutional:      General: She is not in acute distress.    Appearance: Normal appearance. She is well-developed.  HENT:     Head: Normocephalic.     Right Ear: Tympanic membrane normal.     Left Ear: Tympanic membrane normal.     Nose: Nose normal.     Mouth/Throat:     Mouth: Mucous membranes are moist.  Eyes:     Pupils: Pupils are equal, round, and  reactive to light.  Neck:  Vascular: No carotid bruit or JVD.  Cardiovascular:     Rate and Rhythm: Normal rate and regular rhythm.     Heart sounds: Normal heart sounds.  Pulmonary:     Effort: Pulmonary effort is normal. No respiratory distress.     Breath sounds: Normal breath sounds. No wheezing or rales.  Chest:     Chest wall: No tenderness.  Abdominal:     General: Bowel sounds are normal. There is no distension or abdominal bruit.     Palpations: Abdomen is soft. There is no hepatomegaly, splenomegaly, mass or pulsatile mass.     Tenderness: There is no abdominal tenderness.  Musculoskeletal:        General: Normal range of motion.     Cervical back: Normal range of motion and neck supple.  Lymphadenopathy:     Cervical: No cervical adenopathy.  Skin:    General: Skin is warm and dry.  Neurological:     Mental Status: She is alert and oriented to person, place, and time.     Deep Tendon Reflexes: Reflexes are normal and symmetric.  Psychiatric:        Behavior: Behavior normal.        Thought Content: Thought content normal.        Judgment: Judgment normal.      BP (!) 147/81   Pulse 67   Temp 97.7 F (36.5 C) (Temporal)   Resp 20   Ht 5\' 3"  (1.6 m)   Wt 148 lb (67.1 kg)   SpO2 95%   BMI 26.22 kg/m       Assessment & Plan:  Mayrene Megginson comes in today with chief complaint of Medical Management of Chronic Issues   Diagnosis and orders addressed:  1. Primary hypertension Lo sodium diet - CBC with Differential/Platelet - CMP14+EGFR - amLODipine (NORVASC) 5 MG tablet; Take 1 tablet (5 mg total) by mouth daily.  Dispense: 90 tablet; Refill: 1 - hydrochlorothiazide (HYDRODIURIL) 25 MG tablet; Take 1 tablet (25 mg total) by mouth daily.  Dispense: 90 tablet; Refill: 1 - lisinopril (ZESTRIL) 40 MG tablet; Take 1 tablet (40 mg total) by mouth daily.  Dispense: 90 tablet; Refill: 1 - metoprolol succinate (TOPROL-XL) 100 MG 24 hr tablet; Take 1 tablet (100  mg total) by mouth daily. with food  Dispense: 90 tablet; Refill: 1  2. Mixed hyperlipidemia Low fat diet - Lipid panel  3. Acquired hypothyroidism Labs pending - Thyroid Panel With TSH - levothyroxine (SYNTHROID) 88 MCG tablet; Take 1 tablet (88 mcg total) by mouth daily before breakfast.  Dispense: 90 tablet; Refill: 1  4. Neuropathy Do not go barefooted - gabapentin (NEURONTIN) 100 MG capsule; Take 1 capsule (100 mg total) by mouth 3 (three) times daily.  Dispense: 90 capsule; Refill: 5  5. GAD (generalized anxiety disorder) - ToxASSURE Select 13 (MW), Urine - clonazePAM (KLONOPIN) 0.5 MG tablet; TAKE ONE (1) TABLET BY MOUTH TWO (2) TIMES DAILY  Dispense: 60 tablet; Refill: 5  6. Recurrent major depressive disorder, in full remission Stress management - citalopram (CELEXA) 20 MG tablet; Take 1 tablet (20 mg total) by mouth daily.  Dispense: 90 tablet; Refill: 1  7. Current smoker Smoking cessation encouraged  8. BMI 27.0-27.9,adult Discussed diet and exercise for person with BMI >25 Will recheck weight in 3-6 months     Labs pending Health Maintenance reviewed Diet and exercise encouraged  Follow up plan: 6 months   Mary-Margaret Hassell Done, FNP

## 2022-07-09 LAB — LIPID PANEL
Chol/HDL Ratio: 3.6 ratio (ref 0.0–4.4)
Cholesterol, Total: 163 mg/dL (ref 100–199)
HDL: 45 mg/dL (ref >39)
LDL Chol Calc (NIH): 93 mg/dL (ref 0–99)
Triglycerides: 144 mg/dL (ref 0–149)
VLDL Cholesterol Cal: 25 mg/dL (ref 5–40)

## 2022-07-09 LAB — THYROID PANEL WITH TSH
Free Thyroxine Index: 2.5 (ref 1.2–4.9)
T3 Uptake Ratio: 25 % (ref 24–39)
T4, Total: 10 ug/dL (ref 4.5–12.0)
TSH: 0.672 u[IU]/mL (ref 0.450–4.500)

## 2022-07-09 LAB — CMP14+EGFR
ALT: 6 IU/L (ref 0–32)
AST: 14 IU/L (ref 0–40)
Albumin/Globulin Ratio: 1.5 (ref 1.2–2.2)
Albumin: 4.2 g/dL (ref 3.7–4.7)
Alkaline Phosphatase: 113 IU/L (ref 44–121)
BUN/Creatinine Ratio: 13 (ref 12–28)
BUN: 16 mg/dL (ref 8–27)
Bilirubin Total: 0.5 mg/dL (ref 0.0–1.2)
CO2: 23 mmol/L (ref 20–29)
Calcium: 9.4 mg/dL (ref 8.7–10.3)
Chloride: 100 mmol/L (ref 96–106)
Creatinine, Ser: 1.26 mg/dL — ABNORMAL HIGH (ref 0.57–1.00)
Globulin, Total: 2.8 g/dL (ref 1.5–4.5)
Glucose: 96 mg/dL (ref 70–99)
Potassium: 3.9 mmol/L (ref 3.5–5.2)
Sodium: 142 mmol/L (ref 134–144)
Total Protein: 7 g/dL (ref 6.0–8.5)
eGFR: 42 mL/min/{1.73_m2} — ABNORMAL LOW (ref >59)

## 2022-07-09 LAB — CBC WITH DIFFERENTIAL/PLATELET
Basophils Absolute: 0.1 10*3/uL (ref 0.0–0.2)
Basos: 1 %
EOS (ABSOLUTE): 0.2 10*3/uL (ref 0.0–0.4)
Eos: 3 %
Hematocrit: 42.9 % (ref 34.0–46.6)
Hemoglobin: 14.5 g/dL (ref 11.1–15.9)
Immature Grans (Abs): 0.1 10*3/uL (ref 0.0–0.1)
Immature Granulocytes: 1 %
Lymphocytes Absolute: 2.5 10*3/uL (ref 0.7–3.1)
Lymphs: 27 %
MCH: 31.9 pg (ref 26.6–33.0)
MCHC: 33.8 g/dL (ref 31.5–35.7)
MCV: 95 fL (ref 79–97)
Monocytes Absolute: 0.7 10*3/uL (ref 0.1–0.9)
Monocytes: 8 %
Neutrophils Absolute: 5.5 10*3/uL (ref 1.4–7.0)
Neutrophils: 60 %
Platelets: 221 10*3/uL (ref 150–450)
RBC: 4.54 x10E6/uL (ref 3.77–5.28)
RDW: 12.3 % (ref 11.7–15.4)
WBC: 9.1 10*3/uL (ref 3.4–10.8)

## 2022-07-11 LAB — TOXASSURE SELECT 13 (MW), URINE

## 2022-07-19 ENCOUNTER — Ambulatory Visit (INDEPENDENT_AMBULATORY_CARE_PROVIDER_SITE_OTHER): Payer: Medicare HMO | Admitting: *Deleted

## 2022-07-19 DIAGNOSIS — E538 Deficiency of other specified B group vitamins: Secondary | ICD-10-CM

## 2022-07-19 NOTE — Progress Notes (Signed)
B12 given right deltoid pt tolerated well.

## 2022-08-19 ENCOUNTER — Ambulatory Visit (INDEPENDENT_AMBULATORY_CARE_PROVIDER_SITE_OTHER): Payer: Medicare HMO

## 2022-08-19 DIAGNOSIS — E538 Deficiency of other specified B group vitamins: Secondary | ICD-10-CM | POA: Diagnosis not present

## 2022-08-19 NOTE — Progress Notes (Signed)
B12 injection left deltoid patient tolerated well

## 2022-08-30 ENCOUNTER — Ambulatory Visit (INDEPENDENT_AMBULATORY_CARE_PROVIDER_SITE_OTHER): Payer: Medicare HMO

## 2022-08-30 DIAGNOSIS — Z780 Asymptomatic menopausal state: Secondary | ICD-10-CM | POA: Diagnosis not present

## 2022-08-30 DIAGNOSIS — M810 Age-related osteoporosis without current pathological fracture: Secondary | ICD-10-CM | POA: Diagnosis not present

## 2022-09-03 ENCOUNTER — Telehealth: Payer: Self-pay

## 2022-09-03 ENCOUNTER — Other Ambulatory Visit: Payer: Self-pay | Admitting: *Deleted

## 2022-09-03 DIAGNOSIS — M810 Age-related osteoporosis without current pathological fracture: Secondary | ICD-10-CM

## 2022-09-03 NOTE — Telephone Encounter (Signed)
Is she taking the neurontin 3x a day or just at bedtime

## 2022-09-03 NOTE — Telephone Encounter (Signed)
Patient states that her neuropathy is still bothering her a great deal and that her legs feel very restless at night. Patient is taking Gabapentin but has not noticed any improvement. She would like to know if there is anything that can be done.

## 2022-09-04 ENCOUNTER — Telehealth: Payer: Self-pay

## 2022-09-04 NOTE — Telephone Encounter (Signed)
Lmtcb.

## 2022-09-04 NOTE — Progress Notes (Signed)
   Care Guide Note  09/04/2022 Name: Denise Stewart MRN: [id number] DOB: 1940-08-25  Referred by: Bennie Pierini, FNP Reason for referral : Care Coordination (Outreach to schedule with pharm d )   Denise Stewart is a 82 y.o. year old female who is a primary care patient of Bennie Pierini, FNP. Denise Stewart was referred to the pharmacist for assistance related to  osteoperosis .    Successful contact was made with the patient to discuss pharmacy services including being ready for the pharmacist to call at least 5 minutes before the scheduled appointment time, to have medication bottles and any blood sugar or blood pressure readings ready for review. The patient agreed to meet with the pharmacist via with the pharmacist via telephone visit on (date/time).  10/18/2022  Penne Lash, RMA Care Guide [facility]  [place], [place] [address] Direct Dial: [phone] [email or web address]

## 2022-09-18 ENCOUNTER — Ambulatory Visit (INDEPENDENT_AMBULATORY_CARE_PROVIDER_SITE_OTHER): Payer: Medicare HMO

## 2022-09-18 DIAGNOSIS — E538 Deficiency of other specified B group vitamins: Secondary | ICD-10-CM | POA: Diagnosis not present

## 2022-10-16 ENCOUNTER — Telehealth: Payer: Self-pay | Admitting: Nurse Practitioner

## 2022-10-16 NOTE — Telephone Encounter (Signed)
Left Nettie Elm a voicemail that she can call the 800 number the [employer] and they will send [place] the paperwork we need to fill out or she can contact [facility] locally and they may be able to help as well

## 2022-10-18 ENCOUNTER — Ambulatory Visit (INDEPENDENT_AMBULATORY_CARE_PROVIDER_SITE_OTHER): Payer: Medicare HMO | Admitting: Pharmacist

## 2022-10-18 DIAGNOSIS — M810 Age-related osteoporosis without current pathological fracture: Secondary | ICD-10-CM

## 2022-10-18 NOTE — Progress Notes (Signed)
     10/18/2022 Name: Denise Stewart MRN: [id number] DOB: [birth year]   S:  38 yoF Presents for osteoporosis evaluation, education, and management.  She is currently taking Oscal supplement  (calcium with vitamin D) over the counter.  She reports no recent falls.  Last fall was 2-3 years ago due to walking at night in the dark.  She reports she broke her shoulder/ribs as a result of the fall.  Current Height:   5'3"   Current Weight:     148lb    Ethnicity:Caucasian   HPI: Does pt already have a diagnosis of:  Osteoporosis?  Yes  Back Pain?  Yes       Kyphosis?  Yes Prior fracture?  Yes compression; reports broken shoulder and ribs Med(s) for Osteoporosis/Osteopenia:  calcium/vit D                                                             PMH: Age at menopause:  9 Hysterectomy?  No Oophorectomy?  No HRT? No Steroid Use?  No Thyroid med?  Yes History of cancer?  No History of digestive disorders (ie Crohn's)?  No Current or previous eating disorders?  No Last Vitamin D Result:  28 (09/2011)  Last GFR Result:  42   FH/SH: Family history of osteoporosis?  No Parent with history of hip fracture?  yes Family history of breast cancer?  Yes, niece Exercise?  No Smoking?  yes Alcohol?  No    DEXA 08/2022 Assessment LUMBAR SPINE (L1-L4):  BMD (in g/cm2): 0.838  T-score: -2.9  Z-score: -1.0   LEFT FEMORAL NECK:  BMD (in g/cm2): 0.656  T-score: -2.7  Z-score: -0.5   LEFT TOTAL HIP:  BMD (in g/cm2): 0.661  T-score: -2.7  Z-score: -0.6   RIGHT FEMORAL NECK:  BMD (in g/cm2): 0.569  T-score: -3.4  Z-score: -1.1   RIGHT TOTAL HIP:  BMD (in g/cm2): 0.593  T-score: -3.3  Z-score: -1.1    FRAX 10-YEAR PROBABILITY OF FRACTURE:  FRAX not reported as the lowest BMD is not in the osteopenia range.   IMPRESSION: Osteoporosis based on BMD.   Fracture risk is increased. Increased risk is based on low BMD, and history of fracture.  Recommendations: 1.  Start  risedronate (actonel) 150mg  (1 pill by mouth every 30 days) vs Prolia; patient prefers to try oral risedronate due to ease and cost.  Prolia can be costly and insurance requires she try/fail oral therapy 1st.  Will watch GFR. Current GFR 42 (would stop if any decline) 2.  continue calcium 1200mg  daily through supplementation or diet.  3.  continue weight bearing exercise - 30 minutes at least 4 days per week.   4.  Counseled and educated about fall risk and prevention.  Recheck DEXA:  2 years  Time spent counseling patient:  30 minutes   Kieth Brightly, PharmD, BCACP Clinical Pharmacist, [facility]

## 2022-10-21 ENCOUNTER — Ambulatory Visit: Payer: Medicare HMO

## 2022-10-21 DIAGNOSIS — E538 Deficiency of other specified B group vitamins: Secondary | ICD-10-CM

## 2022-10-21 NOTE — Progress Notes (Signed)
 Cyanocobalamin injection given to left deltoid.  Patient tolerated well. 

## 2022-10-25 ENCOUNTER — Encounter: Payer: Self-pay | Admitting: Pharmacist

## 2022-10-25 MED ORDER — RISEDRONATE SODIUM 150 MG PO TABS
150.0000 mg | ORAL_TABLET | ORAL | 3 refills | Status: DC
Start: 2022-10-25 — End: 2022-11-07

## 2022-11-07 ENCOUNTER — Other Ambulatory Visit (HOSPITAL_BASED_OUTPATIENT_CLINIC_OR_DEPARTMENT_OTHER): Payer: Self-pay

## 2022-11-07 ENCOUNTER — Ambulatory Visit (INDEPENDENT_AMBULATORY_CARE_PROVIDER_SITE_OTHER): Payer: Medicare HMO | Admitting: Pharmacist

## 2022-11-07 DIAGNOSIS — M810 Age-related osteoporosis without current pathological fracture: Secondary | ICD-10-CM

## 2022-11-07 MED ORDER — ALENDRONATE SODIUM 70 MG PO TABS
70.0000 mg | ORAL_TABLET | ORAL | 11 refills | Status: DC
Start: 2022-11-07 — End: 2023-06-19

## 2022-11-07 NOTE — Progress Notes (Signed)
11/07/2022 Name: Denise Stewart           MRN: [id number]       DOB: 24-Nov-1940     S:  76 yoF Presents for osteoporosis evaluation, education, and management.  She continues to take her Oscal supplement  (calcium with vitamin D) over the counter.  She reports no recent falls.  Last fall was 2-3 years ago due to walking at night in the dark.  She reports she broke her shoulder/ribs as a result of the fall.  She reports she has not started on the medication for her osteoporosis yet.   I connected with  [name] on 11/07/22 by telephone and verified that I am speaking with the correct person using two identifiers. I discussed the limitations of evaluation and management by telemedicine. The patient expressed understanding and agreed to proceed.  Patient was located in her home and PharmD in PCP office during this visit.   Current Height:   5'3"   Current Weight:     148lb     Ethnicity:Caucasian    HPI: Does pt already have a diagnosis of:  Osteoporosis?  Yes   Back Pain?  Yes       Kyphosis?  Yes Prior fracture?  Yes compression; reports broken shoulder and ribs Med(s) for Osteoporosis/Osteopenia:  calcium/vit D                                                             PMH: Age at menopause:  54 Hysterectomy?  No Oophorectomy?  No HRT? No Steroid Use?  No Thyroid med?  Yes History of cancer?  No History of digestive disorders (ie Crohn's)?  No Current or previous eating disorders?  No Last Vitamin D Result:  28 (09/2011)  Last GFR Result:  42    FH/SH: Family history of osteoporosis?  No Parent with history of hip fracture?  yes Family history of breast cancer?  Yes, niece Exercise?  No Smoking?  yes Alcohol?  No      DEXA 08/2022 Assessment LUMBAR SPINE (L1-L4):  BMD (in g/cm2): 0.838  T-score: -2.9  Z-score: -1.0   LEFT FEMORAL NECK:  BMD (in g/cm2): 0.656  T-score: -2.7  Z-score: -0.5   LEFT TOTAL HIP:  BMD (in g/cm2): 0.661  T-score: -2.7   Z-score: -0.6   RIGHT FEMORAL NECK:  BMD (in g/cm2): 0.569  T-score: -3.4  Z-score: -1.1   RIGHT TOTAL HIP:  BMD (in g/cm2): 0.593  T-score: -3.3  Z-score: -1.1    FRAX 10-YEAR PROBABILITY OF FRACTURE:  FRAX not reported as the lowest BMD is not in the osteopenia range.   IMPRESSION: Osteoporosis based on BMD.   Fracture risk is increased. Increased risk is based on low BMD, and history of fracture.   Recommendations: 1.  Cancel risedronate (actonel) 150mg  (1 pill by mouth every 30 days) due to cost ($47/3 months may enter coverage gap sooner/costly generic).  Start alendronate 70mg  once weekly.  Could try Prolia if patient is unable to tolerate a PO bisphosphonate Will watch GFR. Current GFR 42 (would stop if any decline) 2.  continue calcium 1200mg  daily through supplementation or diet.  3.  continue weight bearing exercise - 30 minutes at least 4 days per week.  4.  Counseled and educated about fall risk and prevention.   Recheck DEXA:  2 years   Time spent counseling patient:  30 minutes     Kieth Brightly, PharmD, BCACP Clinical Pharmacist, [facility]

## 2022-11-20 ENCOUNTER — Ambulatory Visit (INDEPENDENT_AMBULATORY_CARE_PROVIDER_SITE_OTHER): Payer: Medicare HMO | Admitting: *Deleted

## 2022-11-20 DIAGNOSIS — E538 Deficiency of other specified B group vitamins: Secondary | ICD-10-CM | POA: Diagnosis not present

## 2022-11-20 NOTE — Progress Notes (Signed)
B12 injection given right deltoid intramuscular

## 2022-12-23 ENCOUNTER — Ambulatory Visit (INDEPENDENT_AMBULATORY_CARE_PROVIDER_SITE_OTHER): Payer: Medicare HMO

## 2022-12-23 DIAGNOSIS — E538 Deficiency of other specified B group vitamins: Secondary | ICD-10-CM

## 2022-12-23 NOTE — Progress Notes (Signed)
Cyanocobalamin injection given to left deltoid.  Patient tolerated well.

## 2022-12-31 ENCOUNTER — Other Ambulatory Visit: Payer: Self-pay | Admitting: Nurse Practitioner

## 2022-12-31 DIAGNOSIS — I10 Essential (primary) hypertension: Secondary | ICD-10-CM

## 2022-12-31 DIAGNOSIS — F3342 Major depressive disorder, recurrent, in full remission: Secondary | ICD-10-CM

## 2023-01-13 ENCOUNTER — Encounter: Payer: Self-pay | Admitting: Nurse Practitioner

## 2023-01-13 ENCOUNTER — Ambulatory Visit (INDEPENDENT_AMBULATORY_CARE_PROVIDER_SITE_OTHER): Payer: Medicare HMO | Admitting: Nurse Practitioner

## 2023-01-13 VITALS — BP 146/82 | HR 62 | Temp 98.4°F | Resp 20 | Ht 63.0 in | Wt 146.0 lb

## 2023-01-13 DIAGNOSIS — G629 Polyneuropathy, unspecified: Secondary | ICD-10-CM

## 2023-01-13 DIAGNOSIS — E039 Hypothyroidism, unspecified: Secondary | ICD-10-CM | POA: Diagnosis not present

## 2023-01-13 DIAGNOSIS — E782 Mixed hyperlipidemia: Secondary | ICD-10-CM

## 2023-01-13 DIAGNOSIS — F3342 Major depressive disorder, recurrent, in full remission: Secondary | ICD-10-CM

## 2023-01-13 DIAGNOSIS — F411 Generalized anxiety disorder: Secondary | ICD-10-CM

## 2023-01-13 DIAGNOSIS — I10 Essential (primary) hypertension: Secondary | ICD-10-CM | POA: Diagnosis not present

## 2023-01-13 DIAGNOSIS — Z6827 Body mass index (BMI) 27.0-27.9, adult: Secondary | ICD-10-CM | POA: Diagnosis not present

## 2023-01-13 DIAGNOSIS — F17200 Nicotine dependence, unspecified, uncomplicated: Secondary | ICD-10-CM | POA: Diagnosis not present

## 2023-01-13 MED ORDER — AMLODIPINE BESYLATE 5 MG PO TABS: 5.0000 mg | ORAL_TABLET | Freq: Every day | ORAL | 1 refills | Status: DC

## 2023-01-13 MED ORDER — METOPROLOL SUCCINATE ER 100 MG PO TB24
100.0000 mg | ORAL_TABLET | Freq: Every day | ORAL | 1 refills | Status: DC
Start: 2023-01-13 — End: 2023-07-14

## 2023-01-13 MED ORDER — GABAPENTIN 100 MG PO CAPS
100.0000 mg | ORAL_CAPSULE | Freq: Three times a day (TID) | ORAL | 5 refills | Status: DC
Start: 2023-01-13 — End: 2023-07-14

## 2023-01-13 MED ORDER — LEVOTHYROXINE SODIUM 88 MCG PO TABS
88.0000 ug | ORAL_TABLET | Freq: Every day | ORAL | 1 refills | Status: DC
Start: 2023-01-13 — End: 2023-12-02

## 2023-01-13 MED ORDER — CLONAZEPAM 0.5 MG PO TABS: ORAL_TABLET | ORAL | 5 refills | Status: DC

## 2023-01-13 MED ORDER — CITALOPRAM HYDROBROMIDE 20 MG PO TABS
20.0000 mg | ORAL_TABLET | Freq: Every day | ORAL | 1 refills | Status: DC
Start: 2023-01-13 — End: 2023-07-14

## 2023-01-13 MED ORDER — HYDROCHLOROTHIAZIDE 25 MG PO TABS
25.0000 mg | ORAL_TABLET | Freq: Every day | ORAL | 1 refills | Status: DC
Start: 2023-01-13 — End: 2023-07-14

## 2023-01-13 MED ORDER — LISINOPRIL 40 MG PO TABS
40.0000 mg | ORAL_TABLET | Freq: Every day | ORAL | 1 refills | Status: DC
Start: 2023-01-13 — End: 2023-07-14

## 2023-01-13 NOTE — Progress Notes (Signed)
Subjective:    Patient ID: Denise Stewart, female    DOB: 1940/07/24, 82 y.o.   MRN: [id number]   Chief Complaint: medical management of chronic issues     HPI:  Denise Stewart is a 82 y.o. who identifies as a female who was assigned female at birth.   Social history: Lives with: by herself- family checks on her frequently Work history: retired   Water engineer in today for follow up of the following chronic medical issues:  1. Primary hypertension No c/o chest pain, sob or headache. Does not check blood pressure at home BP Readings from Last 3 Encounters:  07/08/22 (!) 147/81  01/03/22 136/82  07/03/21 120/66     2. Mixed hyperlipidemia Does not watch diet and does no exercise Lab Results  Component Value Date   CHOL 163 07/08/2022   HDL 45 07/08/2022   LDLCALC 93 07/08/2022   TRIG 144 07/08/2022   CHOLHDL 3.6 07/08/2022     3. Acquired hypothyroidism No issues that se is aware of Lab Results  Component Value Date   TSH 0.672 07/08/2022     4. Recurrent major depressive disorder, in full remission (HCC) 5. GAD (generalized anxiety disorder) Is on klonopin BID. Has been on this for a long time.    01/13/2023    2:27 PM 07/08/2022    2:10 PM 01/03/2022    3:40 PM 01/03/2022    3:30 PM  GAD 7 : Generalized Anxiety Score  Nervous, Anxious, on Edge 1 1 1 1   Control/stop worrying 1 1 0 0  Worry too much - different things 1 1 0 0  Trouble relaxing 0 1 0 0  Restless 0 1 0 0  Easily annoyed or irritable 0 1 0 0  Afraid - awful might happen 1 1 0 0  Total GAD 7 Score 4 7 1 1   Anxiety Difficulty Not difficult at all Not difficult at all Not difficult at all Not difficult at all       01/13/2023    2:27 PM 07/08/2022    2:09 PM 04/24/2022   11:23 AM  Depression screen PHQ 2/9  Decreased Interest 1 1 0  Down, Depressed, Hopeless 0 0 0  PHQ - 2 Score 1 1 0  Altered sleeping 0 0   Tired, decreased energy 1 1   Change in appetite 0 0   Feeling bad or failure about  yourself  0 0   Trouble concentrating 0 1   Moving slowly or fidgety/restless 0 0   Suicidal thoughts 0 0   PHQ-9 Score 2 3   Difficult doing work/chores Not difficult at all Not difficult at all      6. Neuropathy Is on neurontin and is doing well. Has constatnt burning in her feet  7. Current smoker Smokes about 1/2 pack a day  8. BMI 27.0-27.9,adult Wt Readings from Last 3 Encounters:  01/13/23 146 lb (66.2 kg)  07/08/22 148 lb (67.1 kg)  04/24/22 144 lb (65.3 kg)   BMI Readings from Last 3 Encounters:  01/13/23 25.86 kg/m  07/08/22 26.22 kg/m  04/24/22 25.51 kg/m      New complaints: None today  Allergies  Allergen Reactions   Celexa [Citalopram Hydrobromide] Anaphylaxis, Shortness Of Breath and Swelling   Zocor [Simvastatin]     myalsia   Pravachol [Pravastatin Sodium] Rash   Outpatient Encounter Medications as of 01/13/2023  Medication Sig   alendronate (FOSAMAX) 70 MG tablet Take 1 tablet (70 mg total)  by mouth every 7 (seven) days. Take with a full glass of water on an empty stomach.   amLODipine (NORVASC) 5 MG tablet Take 1 tablet (5 mg total) by mouth daily.   aspirin 81 MG tablet Take 81 mg by mouth daily.   calcium carbonate (OS-CAL) 600 MG TABS Take 600 mg by mouth 2 (two) times daily with a meal.   Cholecalciferol (VITAMIN D-3) 1000 UNITS CAPS Take 1,000 Units by mouth daily.   citalopram (CELEXA) 20 MG tablet TAKE ONE (1) TABLET BY MOUTH EVERY DAY   clonazePAM (KLONOPIN) 0.5 MG tablet TAKE ONE (1) TABLET BY MOUTH TWO (2) TIMES DAILY   fish oil-omega-3 fatty acids 1000 MG capsule Take 2 g by mouth daily.   gabapentin (NEURONTIN) 100 MG capsule Take 1 capsule (100 mg total) by mouth 3 (three) times daily.   hydrochlorothiazide (HYDRODIURIL) 25 MG tablet Take 1 tablet (25 mg total) by mouth daily.   levothyroxine (SYNTHROID) 88 MCG tablet Take 1 tablet (88 mcg total) by mouth daily before breakfast.   lisinopril (ZESTRIL) 40 MG tablet TAKE ONE (1)  TABLET BY MOUTH EVERY DAY   metoprolol succinate (TOPROL-XL) 100 MG 24 hr tablet TAKE 1 TABLET BY MOUTH DAILY WITH FOOD   Facility-Administered Encounter Medications as of 01/13/2023  Medication   cyanocobalamin (VITAMIN B12) injection 1,000 mcg    Past Surgical History:  Procedure Laterality Date   EYE SURGERY Right 1970   right -UNDER eye lesion removed     Family History  Problem Relation Age of Onset   Hypertension Mother    Heart attack Mother 40   Heart disease Mother    Clotting disorder Father 48   Stroke Brother    Suicidality Sister    Other Sister        train accident   Cancer Sister        liver cancer   Diabetes Brother    Other Brother        storm accident   Diabetes Brother        leg removed    Cancer Brother        lung   Cancer Brother        lymphoma      Controlled substance contract: n/a     Review of Systems  Constitutional:  Negative for diaphoresis.  Eyes:  Negative for pain.  Respiratory:  Negative for shortness of breath.   Cardiovascular:  Negative for chest pain, palpitations and leg swelling.  Gastrointestinal:  Negative for abdominal pain.  Endocrine: Negative for polydipsia.  Skin:  Negative for rash.  Neurological:  Negative for dizziness, weakness and headaches.  Hematological:  Does not bruise/bleed easily.  All other systems reviewed and are negative.      Objective:   Physical Exam Vitals and nursing note reviewed.  Constitutional:      General: She is not in acute distress.    Appearance: Normal appearance. She is well-developed.  HENT:     Head: Normocephalic.     Right Ear: Tympanic membrane normal.     Left Ear: Tympanic membrane normal.     Nose: Nose normal.     Mouth/Throat:     Mouth: Mucous membranes are moist.  Eyes:     Pupils: Pupils are equal, round, and reactive to light.  Neck:     Vascular: No carotid bruit or JVD.  Cardiovascular:     Rate and Rhythm: Normal rate and regular rhythm.  Heart sounds: Normal heart sounds.  Pulmonary:     Effort: Pulmonary effort is normal. No respiratory distress.     Breath sounds: Normal breath sounds. No wheezing or rales.  Chest:     Chest wall: No tenderness.  Abdominal:     General: Bowel sounds are normal. There is no distension or abdominal bruit.     Palpations: Abdomen is soft. There is no hepatomegaly, splenomegaly, mass or pulsatile mass.     Tenderness: There is no abdominal tenderness.  Musculoskeletal:        General: Normal range of motion.     Cervical back: Normal range of motion and neck supple.  Lymphadenopathy:     Cervical: No cervical adenopathy.  Skin:    General: Skin is warm and dry.  Neurological:     Mental Status: She is alert and oriented to person, place, and time.     Deep Tendon Reflexes: Reflexes are normal and symmetric.  Psychiatric:        Behavior: Behavior normal.        Thought Content: Thought content normal.        Judgment: Judgment normal.     BP (!) 146/82   Pulse 62   Temp 98.4 F (36.9 C) (Temporal)   Resp 20   Ht 5\' 3"  (1.6 m)   Wt 146 lb (66.2 kg)   SpO2 92%   BMI 25.86 kg/m        Assessment & Plan:  Khaliyah Northrop comes in today with chief complaint of Medical Management of Chronic Issues   Diagnosis and orders addressed:  1. Primary hypertension Low sodium diet - amLODipine (NORVASC) 5 MG tablet; Take 1 tablet (5 mg total) by mouth daily.  Dispense: 90 tablet; Refill: 1 - hydrochlorothiazide (HYDRODIURIL) 25 MG tablet; Take 1 tablet (25 mg total) by mouth daily.  Dispense: 90 tablet; Refill: 1 - lisinopril (ZESTRIL) 40 MG tablet; Take 1 tablet (40 mg total) by mouth daily.  Dispense: 90 tablet; Refill: 1 - metoprolol succinate (TOPROL-XL) 100 MG 24 hr tablet; Take 1 tablet (100 mg total) by mouth daily. with food  Dispense: 90 tablet; Refill: 1 - CBC with Differential/Platelet - CMP14+EGFR  2. Mixed hyperlipidemia Low fat diet - Lipid panel  3. Acquired  hypothyroidism Labs pending - levothyroxine (SYNTHROID) 88 MCG tablet; Take 1 tablet (88 mcg total) by mouth daily before breakfast.  Dispense: 90 tablet; Refill: 1 - Thyroid Panel With TSH  4. Recurrent major depressive disorder, in full remission (HCC) Stress management - citalopram (CELEXA) 20 MG tablet; Take 1 tablet (20 mg total) by mouth daily.  Dispense: 90 tablet; Refill: 1  5. GAD (generalized anxiety disorder) - clonazePAM (KLONOPIN) 0.5 MG tablet; TAKE ONE (1) TABLET BY MOUTH TWO (2) TIMES DAILY  Dispense: 60 tablet; Refill: 5  6. Neuropathy Do not go barefooted Check feet daily - gabapentin (NEURONTIN) 100 MG capsule; Take 1 capsule (100 mg total) by mouth 3 (three) times daily.  Dispense: 90 capsule; Refill: 5  7. Current smoker Smoking cessation encouraged  8. BMI 27.0-27.9,adult Discussed diet and exercise for person with BMI >25 Will recheck weight in 3-6 months    Labs pending Health Maintenance reviewed Diet and exercise encouraged  Follow up plan: 6 months   Mary-Margaret Daphine Deutscher, FNP

## 2023-01-13 NOTE — Patient Instructions (Signed)
Fall Prevention in the Home, Adult Falls can cause injuries and can happen to people of all ages. There are many things you can do to make your home safer and to help prevent falls. What actions can I take to prevent falls? General information Use good lighting in all rooms. Make sure to: Replace any light bulbs that burn out. Turn on the lights in dark areas and use night-lights. Keep items that you use often in easy-to-reach places. Lower the shelves around your home if needed. Move furniture so that there are clear paths around it. Do not use throw rugs or other things on the floor that can make you trip. If any of your floors are uneven, fix them. Add color or contrast paint or tape to clearly mark and help you see: Grab bars or handrails. First and last steps of staircases. Where the edge of each step is. If you use a ladder or stepladder: Make sure that it is fully opened. Do not climb a closed ladder. Make sure the sides of the ladder are locked in place. Have someone hold the ladder while you use it. Know where your pets are as you move through your home. What can I do in the bathroom?     Keep the floor dry. Clean up any water on the floor right away. Remove soap buildup in the bathtub or shower. Buildup makes bathtubs and showers slippery. Use non-skid mats or decals on the floor of the bathtub or shower. Attach bath mats securely with double-sided, non-slip rug tape. If you need to sit down in the shower, use a non-slip stool. Install grab bars by the toilet and in the bathtub and shower. Do not use towel bars as grab bars. What can I do in the bedroom? Make sure that you have a light by your bed that is easy to reach. Do not use any sheets or blankets on your bed that hang to the floor. Have a firm chair or bench with side arms that you can use for support when you get dressed. What can I do in the kitchen? Clean up any spills right away. If you need to reach something  above you, use a step stool with a grab bar. Keep electrical cords out of the way. Do not use floor polish or wax that makes floors slippery. What can I do with my stairs? Do not leave anything on the stairs. Make sure that you have a light switch at the top and the bottom of the stairs. Make sure that there are handrails on both sides of the stairs. Fix handrails that are broken or loose. Install non-slip stair treads on all your stairs if they do not have carpet. Avoid having throw rugs at the top or bottom of the stairs. Choose a carpet that does not hide the edge of the steps on the stairs. Make sure that the carpet is firmly attached to the stairs. Fix carpet that is loose or worn. What can I do on the outside of my home? Use bright outdoor lighting. Fix the edges of walkways and driveways and fix any cracks. Clear paths of anything that can make you trip, such as tools or rocks. Add color or contrast paint or tape to clearly mark and help you see anything that might make you trip as you walk through a door, such as a raised step or threshold. Trim any bushes or trees on paths to your home. Check to see if handrails are loose   or broken and that both sides of all steps have handrails. Install guardrails along the edges of any raised decks and porches. Have leaves, snow, or ice cleared regularly. Use sand, salt, or ice melter on paths if you live where there is ice and snow during the winter. Clean up any spills in your garage right away. This includes grease or oil spills. What other actions can I take? Review your medicines with your doctor. Some medicines can cause dizziness or changes in blood pressure, which increase your risk of falling. Wear shoes that: Have a low heel. Do not wear high heels. Have rubber bottoms and are closed at the toe. Feel good on your feet and fit well. Use tools that help you move around if needed. These include: Canes. Walkers. Scooters. Crutches. Ask  your doctor what else you can do to help prevent falls. This may include seeing a physical therapist to learn to do exercises to move better and get stronger. Where to find more information Centers for Disease Control and Prevention, [employer]: cdc.gov National Institute on Aging: nia.nih.gov National Institute on Aging: nia.nih.gov Contact a doctor if: You are afraid of falling at home. You feel weak, drowsy, or dizzy at home. You fall at home. Get help right away if you: Lose consciousness or have trouble moving after a fall. Have a fall that causes a head injury. These symptoms may be an emergency. Get help right away. Call 911. Do not wait to see if the symptoms will go away. Do not drive yourself to the hospital. This information is not intended to replace advice given to you by your health care provider. Make sure you discuss any questions you have with your health care provider. Document Revised: 11/26/2021 Document Reviewed: 11/26/2021 Elsevier Patient Education  2024 [employer]  

## 2023-01-14 ENCOUNTER — Encounter: Payer: Self-pay | Admitting: *Deleted

## 2023-01-14 LAB — THYROID PANEL WITH TSH
Free Thyroxine Index: 2.6 (ref 1.2–4.9)
T3 Uptake Ratio: 26 % (ref 24–39)
T4, Total: 10 ug/dL (ref 4.5–12.0)
TSH: 0.358 u[IU]/mL — ABNORMAL LOW (ref 0.450–4.500)

## 2023-01-14 LAB — CBC WITH DIFFERENTIAL/PLATELET
Basophils Absolute: 0.1 10*3/uL (ref 0.0–0.2)
Basos: 1 %
EOS (ABSOLUTE): 0.1 10*3/uL (ref 0.0–0.4)
Eos: 1 %
Hematocrit: 42.3 % (ref 34.0–46.6)
Hemoglobin: 13.7 g/dL (ref 11.1–15.9)
Immature Grans (Abs): 0 10*3/uL (ref 0.0–0.1)
Immature Granulocytes: 0 %
Lymphocytes Absolute: 2.1 10*3/uL (ref 0.7–3.1)
Lymphs: 27 %
MCH: 30.6 pg (ref 26.6–33.0)
MCHC: 32.4 g/dL (ref 31.5–35.7)
MCV: 94 fL (ref 79–97)
Monocytes Absolute: 0.7 10*3/uL (ref 0.1–0.9)
Monocytes: 10 %
Neutrophils Absolute: 4.5 10*3/uL (ref 1.4–7.0)
Neutrophils: 61 %
Platelets: 195 10*3/uL (ref 150–450)
RBC: 4.48 x10E6/uL (ref 3.77–5.28)
RDW: 12.1 % (ref 11.7–15.4)
WBC: 7.5 10*3/uL (ref 3.4–10.8)

## 2023-01-14 LAB — CMP14+EGFR
ALT: 8 IU/L (ref 0–32)
AST: 15 IU/L (ref 0–40)
Albumin: 4 g/dL (ref 3.7–4.7)
Alkaline Phosphatase: 92 IU/L (ref 44–121)
BUN/Creatinine Ratio: 15 (ref 12–28)
BUN: 17 mg/dL (ref 8–27)
Bilirubin Total: 0.5 mg/dL (ref 0.0–1.2)
CO2: 25 mmol/L (ref 20–29)
Calcium: 9.3 mg/dL (ref 8.7–10.3)
Chloride: 100 mmol/L (ref 96–106)
Creatinine, Ser: 1.17 mg/dL — ABNORMAL HIGH (ref 0.57–1.00)
Globulin, Total: 2.7 g/dL (ref 1.5–4.5)
Glucose: 86 mg/dL (ref 70–99)
Potassium: 4.1 mmol/L (ref 3.5–5.2)
Sodium: 141 mmol/L (ref 134–144)
Total Protein: 6.7 g/dL (ref 6.0–8.5)
eGFR: 46 mL/min/{1.73_m2} — ABNORMAL LOW (ref >59)

## 2023-01-14 LAB — LIPID PANEL
Cholesterol, Total: 157 mg/dL (ref 100–199)
HDL: 45 mg/dL (ref >39)
LDL CALC COMMENT:: 3.5 ratio (ref 0.0–4.4)
LDL Chol Calc (NIH): 92 mg/dL (ref 0–99)
Triglycerides: 113 mg/dL (ref 0–149)
VLDL Cholesterol Cal: 20 mg/dL (ref 5–40)

## 2023-01-22 ENCOUNTER — Ambulatory Visit: Payer: Medicare HMO

## 2023-01-22 DIAGNOSIS — E538 Deficiency of other specified B group vitamins: Secondary | ICD-10-CM

## 2023-01-22 NOTE — Progress Notes (Signed)
Cyanocobalamin injection given to right deltoid.  Patient tolerated well. 

## 2023-02-24 ENCOUNTER — Ambulatory Visit (INDEPENDENT_AMBULATORY_CARE_PROVIDER_SITE_OTHER): Payer: Medicare HMO

## 2023-02-24 DIAGNOSIS — E538 Deficiency of other specified B group vitamins: Secondary | ICD-10-CM | POA: Diagnosis not present

## 2023-02-24 NOTE — Progress Notes (Signed)
B12 injection given in left deltoid and tolerated well.

## 2023-03-26 ENCOUNTER — Ambulatory Visit (INDEPENDENT_AMBULATORY_CARE_PROVIDER_SITE_OTHER): Payer: Medicare HMO

## 2023-03-26 DIAGNOSIS — E538 Deficiency of other specified B group vitamins: Secondary | ICD-10-CM

## 2023-03-26 NOTE — Progress Notes (Signed)
Patient is in office today for a nurse visit for B12 Injection. Patient Injection was given in the  Right deltoid. Patient tolerated injection well.

## 2023-04-28 ENCOUNTER — Ambulatory Visit (INDEPENDENT_AMBULATORY_CARE_PROVIDER_SITE_OTHER): Payer: Medicare HMO

## 2023-04-28 VITALS — Ht 63.0 in | Wt 146.0 lb

## 2023-04-28 DIAGNOSIS — Z0000 Encounter for general adult medical examination without abnormal findings: Secondary | ICD-10-CM

## 2023-04-28 NOTE — Progress Notes (Signed)
Subjective:   Denise Stewart is a 83 y.o. female who presents for Medicare Annual (Subsequent) preventive examination.  Visit Complete: Virtual I connected with  Denise Stewart on 04/28/23 by a audio enabled telemedicine application and verified that I am speaking with the correct person using two identifiers.  Patient Location: Home  Provider Location: Home Office  This patient declined Interactive audio and video telecommunications. Therefore the visit was completed with audio only.  I discussed the limitations of evaluation and management by telemedicine. The patient expressed understanding and agreed to proceed.  Vital Signs: Because this visit was a virtual/telehealth visit, some criteria may be missing or patient reported. Any vitals not documented were not able to be obtained and vitals that have been documented are patient reported.  Cardiac Risk Factors include: advanced age (>39men, >73 women);smoking/ tobacco exposure;hypertension;dyslipidemia     Objective:    Today's Vitals   04/28/23 1515  Weight: 146 lb (66.2 kg)  Height: 5\' 3"  (1.6 m)   Body mass index is 25.86 kg/m.     04/28/2023    3:28 PM 04/24/2022   11:25 AM 04/23/2021   11:08 AM 04/20/2020    1:20 PM 04/20/2019    1:27 PM 05/13/2017    2:20 PM 03/18/2014    2:58 PM  Advanced Directives  Does Patient Have a Medical Advance Directive? No Yes Yes Yes Yes No No  Type of Special educational needs teacher of [place];Living will Healthcare Power of [employer] Power of [employer] Power of [place];Living will    Does patient want to make changes to medical advance directive?    No - Patient declined No - Patient declined    Copy of Healthcare Power of Attorney in Chart?  No - copy requested No - copy requested No - copy requested No - copy requested    Would patient like information on creating a medical advance directive? Yes (MAU/Ambulatory/Procedural Areas - Information given)     Yes  (MAU/Ambulatory/Procedural Areas - Information given) Yes - Educational materials given    Current Medications (verified) Outpatient Encounter Medications as of 04/28/2023  Medication Sig   alendronate (FOSAMAX) 70 MG tablet Take 1 tablet (70 mg total) by mouth every 7 (seven) days. Take with a full glass of water on an empty stomach.   amLODipine (NORVASC) 5 MG tablet Take 1 tablet (5 mg total) by mouth daily.   aspirin 81 MG tablet Take 81 mg by mouth daily.   calcium carbonate (OS-CAL) 600 MG TABS Take 600 mg by mouth 2 (two) times daily with a meal.   Cholecalciferol (VITAMIN D-3) 1000 UNITS CAPS Take 1,000 Units by mouth daily.   citalopram (CELEXA) 20 MG tablet Take 1 tablet (20 mg total) by mouth daily.   clonazePAM (KLONOPIN) 0.5 MG tablet TAKE ONE (1) TABLET BY MOUTH TWO (2) TIMES DAILY   fish oil-omega-3 fatty acids 1000 MG capsule Take 2 g by mouth daily.   gabapentin (NEURONTIN) 100 MG capsule Take 1 capsule (100 mg total) by mouth 3 (three) times daily.   hydrochlorothiazide (HYDRODIURIL) 25 MG tablet Take 1 tablet (25 mg total) by mouth daily.   levothyroxine (SYNTHROID) 88 MCG tablet Take 1 tablet (88 mcg total) by mouth daily before breakfast.   lisinopril (ZESTRIL) 40 MG tablet Take 1 tablet (40 mg total) by mouth daily.   metoprolol succinate (TOPROL-XL) 100 MG 24 hr tablet Take 1 tablet (100 mg total) by mouth daily. with food   Facility-Administered Encounter  Medications as of 04/28/2023  Medication   cyanocobalamin (VITAMIN B12) injection 1,000 mcg    Allergies (verified) Celexa [citalopram hydrobromide], Zocor [simvastatin], and Pravachol [pravastatin sodium]   History: Past Medical History:  Diagnosis Date   Depression    GAD (generalized anxiety disorder)    Hyperlipidemia    Hypertension    Thyroid disease    Past Surgical History:  Procedure Laterality Date   EYE SURGERY Right 1970   right -UNDER eye lesion removed    Family History  Problem Relation  Age of Onset   Hypertension Mother    Heart attack Mother 77   Heart disease Mother    Clotting disorder Father 33   Stroke Brother    Suicidality Sister    Other Sister        train accident   Cancer Sister        liver cancer   Diabetes Brother    Other Brother        storm accident   Diabetes Brother        leg removed    Cancer Brother        lung   Cancer Brother        lymphoma   Social History   Socioeconomic History   Marital status: Widowed    Spouse name: Not on file   Number of children: 2   Years of education: 10   Highest education level: 10th grade  Occupational History   Occupation: retired    Associate Professor: [employer]  Tobacco Use   Smoking status: Every Day    Current packs/day: 0.50    Average packs/day: 0.5 packs/day for 70.1 years (35.0 ttl pk-yrs)    Types: Cigarettes    Start date: 04/08/1953   Smokeless tobacco: Never  Vaping Use   Vaping status: Never Used  Substance and Sexual Activity   Alcohol use: No   Drug use: No   Sexual activity: Not Currently    Birth control/protection: Post-menopausal  Other Topics Concern   Not on file  Social History Narrative   Lives alone on one level -   Daughter and son call and visit several times daily   Social Drivers of Health   Financial Resource Strain: Low Risk  (04/28/2023)   Overall Financial Resource Strain (CARDIA)    Difficulty of Paying Living Expenses: Not hard at all  Food Insecurity: No Food Insecurity (04/28/2023)   Hunger Vital Sign    Worried About Running Out of Food in the Last Year: Never true    Ran Out of Food in the Last Year: Never true  Transportation Needs: No Transportation Needs (04/28/2023)   PRAPARE - Administrator, Civil Service (Medical): No    Lack of Transportation (Non-Medical): No  Physical Activity: Insufficiently Active (04/28/2023)   Exercise Vital Sign    Days of Exercise per Week: 3 days    Minutes of Exercise per Session: 30 min  Stress:  No Stress Concern Present (04/28/2023)   [employer] of Occupational Health - Occupational Stress Questionnaire    Feeling of Stress : Not at all  Social Connections: Moderately Isolated (04/28/2023)   Social Connection and Isolation Panel [NHANES]    Frequency of Communication with Friends and Family: More than three times a week    Frequency of Social Gatherings with Friends and Family: Three times a week    Attends Religious Services: 1 to 4 times per year    Active Member of  Clubs or Organizations: No    Attends Banker Meetings: Never    Marital Status: Widowed    Tobacco Counseling Ready to quit: Not Answered Counseling given: Not Answered   Clinical Intake:  Pre-visit preparation completed: Yes  Pain : No/denies pain     Diabetes: No  How often do you need to have someone help you when you read instructions, pamphlets, or other written materials from your doctor or pharmacy?: 1 - Never  Interpreter Needed?: No  Information entered by :: Kandis Fantasia LPN   Activities of Daily Living    04/28/2023    3:27 PM  In your present state of health, do you have any difficulty performing the following activities:  Hearing? 0  Vision? 0  Difficulty concentrating or making decisions? 0  Walking or climbing stairs? 0  Dressing or bathing? 0  Doing errands, shopping? 0  Preparing Food and eating ? N  Using the Toilet? N  In the past six months, have you accidently leaked urine? N  Do you have problems with loss of bowel control? N  Managing your Medications? N  Managing your Finances? N  Housekeeping or managing your Housekeeping? N    Patient Care Team: Bennie Pierini, FNP as PCP - General (Nurse Practitioner)  Indicate any recent Medical Services you may have received from other than [facility] providers in the past year (date may be approximate).     Assessment:   This is a routine wellness examination for [place].  Hearing/Vision  screen Hearing Screening - Comments:: Denies hearing difficulties   Vision Screening - Comments:: Wears rx glasses - up to date with routine eye exams with [facility]     Goals Addressed             This Visit's Progress    COMPLETED: Patient Stated       04/20/2020 AWV Goal: Exercise for General Health  Patient will verbalize understanding of the benefits of increased physical activity: Exercising regularly is important. It will improve your overall fitness, flexibility, and endurance. Regular exercise also will improve your overall health. It can help you control your weight, reduce stress, and improve your bone density. Over the next year, patient will increase physical activity as tolerated with a goal of at least 150 minutes of moderate physical activity per week.  You can tell that you are exercising at a moderate intensity if your heart starts beating faster and you start breathing faster but can still hold a conversation. Moderate-intensity exercise ideas include: Walking 1 mile (1.6 km) in about 15 minutes Biking Hiking Golfing Dancing Water aerobics Patient will verbalize understanding of everyday activities that increase physical activity by providing examples like the following: Yard work, such as: Insurance underwriter Gardening Washing windows or floors Patient will be able to explain general safety guidelines for exercising:  Before you start a new exercise program, talk with your health care provider. Do not exercise so much that you hurt yourself, feel dizzy, or get very short of breath. Wear comfortable clothes and wear shoes with good support. Drink plenty of water while you exercise to prevent dehydration or heat stroke. Work out until your breathing and your heartbeat get faster.       Depression Screen    04/28/2023    3:25 PM 01/13/2023    2:27 PM 07/08/2022     2:09 PM 04/24/2022   11:23  AM 01/03/2022    3:40 PM 01/03/2022    3:29 PM 07/03/2021    2:39 PM  PHQ 2/9 Scores  PHQ - 2 Score 1 1 1  0 2 2   PHQ- 9 Score 2 2 3  3 3    Exception Documentation       Patient refusal    Fall Risk    04/28/2023    3:27 PM 01/13/2023    2:27 PM 04/24/2022   11:21 AM 01/03/2022    3:40 PM 07/03/2021    2:39 PM  Fall Risk   Falls in the past year? 0 0 0 0 1  Number falls in past yr: 0  0  0  Injury with Fall? 0  0  0  Risk for fall due to : No Fall Risks  No Fall Risks  History of fall(s)  Follow up Falls prevention discussed;Education provided;Falls evaluation completed  Falls prevention discussed  Falls evaluation completed    MEDICARE RISK AT HOME: Medicare Risk at Home Any stairs in or around the home?: No If so, are there any without handrails?: No Home free of loose throw rugs in walkways, pet beds, electrical cords, etc?: Yes Adequate lighting in your home to reduce risk of falls?: Yes Life alert?: No Use of a cane, walker or w/c?: No Grab bars in the bathroom?: Yes Shower chair or bench in shower?: No Elevated toilet seat or a handicapped toilet?: Yes  TIMED UP AND GO:  Was the test performed?  No    Cognitive Function:    05/13/2017    2:26 PM  MMSE - Mini Mental State Exam  Orientation to time 5  Orientation to Place 5  Registration 3  Attention/ Calculation 5  Recall 3  Language- name 2 objects 2  Language- repeat 1  Language- follow 3 step command 2  Language- read & follow direction 1  Write a sentence 1  Copy design 1  Total score 29        04/28/2023    3:27 PM 04/24/2022   11:25 AM 04/20/2020    1:23 PM 04/20/2019    1:34 PM  6CIT Screen  What Year? 0 points 0 points 0 points 0 points  What month? 0 points 0 points 0 points 0 points  What time? 0 points 0 points 0 points 0 points  Count back from 20 0 points 0 points 0 points 0 points  Months in reverse 0 points 0 points 0 points 0 points  Repeat phrase 2 points 0  points 0 points 0 points  Total Score 2 points 0 points 0 points 0 points    Immunizations Immunization History  Administered Date(s) Administered   Pneumococcal Conjugate-13 08/29/2014   Pneumococcal Polysaccharide-23 10/20/2015    TDAP status: Due, Education has been provided regarding the importance of this vaccine. Advised may receive this vaccine at local pharmacy or Health Dept. Aware to provide a copy of the vaccination record if obtained from local pharmacy or Health Dept. Verbalized acceptance and understanding.  Flu Vaccine status: Declined, Education has been provided regarding the importance of this vaccine but patient still declined. Advised may receive this vaccine at local pharmacy or Health Dept. Aware to provide a copy of the vaccination record if obtained from local pharmacy or Health Dept. Verbalized acceptance and understanding.  Pneumococcal vaccine status: Up to date  Covid-19 vaccine status: Declined, Education has been provided regarding the importance of this vaccine but patient still declined. Advised may receive  this vaccine at local pharmacy or Health Dept.or vaccine clinic. Aware to provide a copy of the vaccination record if obtained from local pharmacy or Health Dept. Verbalized acceptance and understanding.  Qualifies for Shingles Vaccine? Yes   Zostavax completed No   Shingrix Completed?: No.    Education has been provided regarding the importance of this vaccine. Patient has been advised to call insurance company to determine out of pocket expense if they have not yet received this vaccine. Advised may also receive vaccine at local pharmacy or Health Dept. Verbalized acceptance and understanding.  Screening Tests Health Maintenance  Topic Date Due   DTaP/Tdap/Td (1 - Tdap) Never done   Zoster Vaccines- Shingrix (1 of 2) Never done   COVID-19 Vaccine (1 - 2024-25 season) Never done   INFLUENZA VACCINE  07/07/2023 (Originally 11/07/2022)   MAMMOGRAM   07/08/2023 (Originally 09/19/1958)   DEXA SCAN  08/30/2023   Medicare Annual Wellness (AWV)  04/27/2024   Pneumonia Vaccine 34+ Years old  Completed   HPV VACCINES  Aged Out    Health Maintenance  Health Maintenance Due  Topic Date Due   DTaP/Tdap/Td (1 - Tdap) Never done   Zoster Vaccines- Shingrix (1 of 2) Never done   COVID-19 Vaccine (1 - 2024-25 season) Never done    Colorectal cancer screening: No longer required.   Mammogram status: No longer required due to age and preference.  Bone Density status: Completed 08/30/22. Results reflect: Bone density results: OSTEOPOROSIS. Repeat every 2 years.  Lung Cancer Screening: (Low Dose CT Chest recommended if Age 32-80 years, 20 pack-year currently smoking OR have quit w/in 15years.) does not qualify.   Lung Cancer Screening Referral: n/a  Additional Screening:  Hepatitis C Screening: does not qualify  Vision Screening: Recommended annual ophthalmology exams for early detection of glaucoma and other disorders of the eye. Is the patient up to date with their annual eye exam?  Yes  Who is the provider or what is the name of the office in which the patient attends annual eye exams? Dr. Conley Rolls  If pt is not established with a provider, would they like to be referred to a provider to establish care? No .   Dental Screening: Recommended annual dental exams for proper oral hygiene  Community Resource Referral / Chronic Care Management: CRR required this visit?  No   CCM required this visit?  No     Plan:     I have personally reviewed and noted the following in the patient's chart:   Medical and social history Use of alcohol, tobacco or illicit drugs  Current medications and supplements including opioid prescriptions. Patient is not currently taking opioid prescriptions. Functional ability and status Nutritional status Physical activity Advanced directives List of other physicians Hospitalizations, surgeries, and ER visits in  previous 12 months Vitals Screenings to include cognitive, depression, and falls Referrals and appointments  In addition, I have reviewed and discussed with patient certain preventive protocols, quality metrics, and best practice recommendations. A written personalized care plan for preventive services as well as general preventive health recommendations were provided to patient.     Kandis Fantasia [place], [place]   1/61/0960   After Visit Summary: (Mail) Due to this being a telephonic visit, the after visit summary with patients personalized plan was offered to patient via mail   Nurse Notes: No concerns at this time

## 2023-04-28 NOTE — Patient Instructions (Signed)
Denise Stewart , Thank you for taking time to come for your Medicare Wellness Visit. I appreciate your ongoing commitment to your health goals. Please review the following plan we discussed and let me know if I can assist you in the future.   Referrals/Orders/Follow-Ups/Clinician Recommendations: Aim for 30 minutes of exercise or brisk walking, 6-8 glasses of water, and 5 servings of fruits and vegetables each day.  This is a list of the screening recommended for you and due dates:  Health Maintenance  Topic Date Due   DTaP/Tdap/Td vaccine (1 - Tdap) Never done   Zoster (Shingles) Vaccine (1 of 2) Never done   COVID-19 Vaccine (1 - 2024-25 season) Never done   Flu Shot  07/07/2023*   Mammogram  07/08/2023*   DEXA scan (bone density measurement)  08/30/2023   Medicare Annual Wellness Visit  04/27/2024   Pneumonia Vaccine  Completed   HPV Vaccine  Aged Out  *Topic was postponed. The date shown is not the original due date.    Advanced directives: (ACP Link)Information on Advanced Care Planning can be found at [facility] of [place] Advance Health Care Directives Advance Health Care Directives ([email or web address]/)   Next Medicare Annual Wellness Visit scheduled for next year: Yes

## 2023-04-30 ENCOUNTER — Ambulatory Visit: Payer: Medicare HMO

## 2023-05-02 ENCOUNTER — Ambulatory Visit (INDEPENDENT_AMBULATORY_CARE_PROVIDER_SITE_OTHER): Payer: Medicare HMO | Admitting: *Deleted

## 2023-05-02 DIAGNOSIS — E538 Deficiency of other specified B group vitamins: Secondary | ICD-10-CM

## 2023-05-02 NOTE — Progress Notes (Signed)
Patient in today for monthly B 12 injection. Tolerated well.

## 2023-06-06 ENCOUNTER — Ambulatory Visit: Payer: Medicare HMO

## 2023-06-06 DIAGNOSIS — E538 Deficiency of other specified B group vitamins: Secondary | ICD-10-CM

## 2023-06-06 MED ORDER — CYANOCOBALAMIN 1000 MCG/ML IJ SOLN
1000.0000 ug | Freq: Once | INTRAMUSCULAR | Status: AC
Start: 2023-06-06 — End: 2023-06-06
  Administered 2023-06-06: 1000 ug via INTRAMUSCULAR

## 2023-06-06 NOTE — Progress Notes (Signed)
 Patient is in office today for a nurse visit for B12 Injection. Patient Injection was given in the  Right deltoid. Patient tolerated injection well.

## 2023-06-19 ENCOUNTER — Other Ambulatory Visit: Payer: Self-pay | Admitting: Nurse Practitioner

## 2023-06-19 DIAGNOSIS — M810 Age-related osteoporosis without current pathological fracture: Secondary | ICD-10-CM

## 2023-07-07 ENCOUNTER — Ambulatory Visit (INDEPENDENT_AMBULATORY_CARE_PROVIDER_SITE_OTHER): Payer: Medicare HMO

## 2023-07-07 DIAGNOSIS — E538 Deficiency of other specified B group vitamins: Secondary | ICD-10-CM | POA: Diagnosis not present

## 2023-07-07 MED ORDER — CYANOCOBALAMIN 1000 MCG/ML IJ SOLN
1000.0000 ug | Freq: Once | INTRAMUSCULAR | Status: AC
Start: 2023-07-07 — End: 2023-07-07
  Administered 2023-07-07: 1000 ug via INTRAMUSCULAR

## 2023-07-07 NOTE — Progress Notes (Signed)
 Patient is in office today for a nurse visit for B12 Injection. Patient Injection was given in the  Left deltoid. Patient tolerated injection well.

## 2023-07-14 ENCOUNTER — Ambulatory Visit: Payer: Medicare HMO | Admitting: Nurse Practitioner

## 2023-07-14 ENCOUNTER — Encounter: Payer: Self-pay | Admitting: Nurse Practitioner

## 2023-07-14 VITALS — BP 163/84 | HR 61 | Temp 96.7°F | Ht 63.0 in | Wt 149.0 lb

## 2023-07-14 DIAGNOSIS — M810 Age-related osteoporosis without current pathological fracture: Secondary | ICD-10-CM | POA: Diagnosis not present

## 2023-07-14 DIAGNOSIS — E782 Mixed hyperlipidemia: Secondary | ICD-10-CM

## 2023-07-14 DIAGNOSIS — G629 Polyneuropathy, unspecified: Secondary | ICD-10-CM | POA: Diagnosis not present

## 2023-07-14 DIAGNOSIS — F411 Generalized anxiety disorder: Secondary | ICD-10-CM

## 2023-07-14 DIAGNOSIS — Z0001 Encounter for general adult medical examination with abnormal findings: Secondary | ICD-10-CM

## 2023-07-14 DIAGNOSIS — Z0000 Encounter for general adult medical examination without abnormal findings: Secondary | ICD-10-CM

## 2023-07-14 DIAGNOSIS — E039 Hypothyroidism, unspecified: Secondary | ICD-10-CM

## 2023-07-14 DIAGNOSIS — F17200 Nicotine dependence, unspecified, uncomplicated: Secondary | ICD-10-CM | POA: Diagnosis not present

## 2023-07-14 DIAGNOSIS — F3342 Major depressive disorder, recurrent, in full remission: Secondary | ICD-10-CM

## 2023-07-14 DIAGNOSIS — Z6827 Body mass index (BMI) 27.0-27.9, adult: Secondary | ICD-10-CM

## 2023-07-14 DIAGNOSIS — I10 Essential (primary) hypertension: Secondary | ICD-10-CM

## 2023-07-14 LAB — LIPID PANEL

## 2023-07-14 MED ORDER — AMLODIPINE BESYLATE 10 MG PO TABS: 10.0000 mg | ORAL_TABLET | Freq: Every day | ORAL | 1 refills | Status: DC

## 2023-07-14 MED ORDER — GABAPENTIN 100 MG PO CAPS: 100.0000 mg | ORAL_CAPSULE | Freq: Three times a day (TID) | ORAL | 5 refills | Status: DC

## 2023-07-14 MED ORDER — CITALOPRAM HYDROBROMIDE 20 MG PO TABS
20.0000 mg | ORAL_TABLET | Freq: Every day | ORAL | 1 refills | Status: DC
Start: 2023-07-14 — End: 2024-01-19

## 2023-07-14 MED ORDER — LISINOPRIL 40 MG PO TABS
40.0000 mg | ORAL_TABLET | Freq: Every day | ORAL | 1 refills | Status: DC
Start: 2023-07-14 — End: 2024-01-19

## 2023-07-14 MED ORDER — METOPROLOL SUCCINATE ER 100 MG PO TB24: 100.0000 mg | ORAL_TABLET | Freq: Every day | ORAL | 1 refills | Status: DC

## 2023-07-14 MED ORDER — ALENDRONATE SODIUM 70 MG PO TABS: 70.0000 mg | ORAL_TABLET | ORAL | 1 refills | Status: DC

## 2023-07-14 MED ORDER — HYDROCHLOROTHIAZIDE 25 MG PO TABS
25.0000 mg | ORAL_TABLET | Freq: Every day | ORAL | 1 refills | Status: DC
Start: 2023-07-14 — End: 2023-12-22

## 2023-07-14 MED ORDER — CLONAZEPAM 0.5 MG PO TABS: ORAL_TABLET | ORAL | 5 refills | Status: DC

## 2023-07-14 NOTE — Patient Instructions (Signed)
 Fall Prevention in the Home, Adult Falls can cause injuries and can happen to people of all ages. There are many things you can do to make your home safer and to help prevent falls. What actions can I take to prevent falls? General information Use good lighting in all rooms. Make sure to: Replace any light bulbs that burn out. Turn on the lights in dark areas and use night-lights. Keep items that you use often in easy-to-reach places. Lower the shelves around your home if needed. Move furniture so that there are clear paths around it. Do not use throw rugs or other things on the floor that can make you trip. If any of your floors are uneven, fix them. Add color or contrast paint or tape to clearly mark and help you see: Grab bars or handrails. First and last steps of staircases. Where the edge of each step is. If you use a ladder or stepladder: Make sure that it is fully opened. Do not climb a closed ladder. Make sure the sides of the ladder are locked in place. Have someone hold the ladder while you use it. Know where your pets are as you move through your home. What can I do in the bathroom?     Keep the floor dry. Clean up any water on the floor right away. Remove soap buildup in the bathtub or shower. Buildup makes bathtubs and showers slippery. Use non-skid mats or decals on the floor of the bathtub or shower. Attach bath mats securely with double-sided, non-slip rug tape. If you need to sit down in the shower, use a non-slip stool. Install grab bars by the toilet and in the bathtub and shower. Do not use towel bars as grab bars. What can I do in the bedroom? Make sure that you have a light by your bed that is easy to reach. Do not use any sheets or blankets on your bed that hang to the floor. Have a firm chair or bench with side arms that you can use for support when you get dressed. What can I do in the kitchen? Clean up any spills right away. If you need to reach something  above you, use a step stool with a grab bar. Keep electrical cords out of the way. Do not use floor polish or wax that makes floors slippery. What can I do with my stairs? Do not leave anything on the stairs. Make sure that you have a light switch at the top and the bottom of the stairs. Make sure that there are handrails on both sides of the stairs. Fix handrails that are broken or loose. Install non-slip stair treads on all your stairs if they do not have carpet. Avoid having throw rugs at the top or bottom of the stairs. Choose a carpet that does not hide the edge of the steps on the stairs. Make sure that the carpet is firmly attached to the stairs. Fix carpet that is loose or worn. What can I do on the outside of my home? Use bright outdoor lighting. Fix the edges of walkways and driveways and fix any cracks. Clear paths of anything that can make you trip, such as tools or rocks. Add color or contrast paint or tape to clearly mark and help you see anything that might make you trip as you walk through a door, such as a raised step or threshold. Trim any bushes or trees on paths to your home. Check to see if handrails are loose  or broken and that both sides of all steps have handrails. Install guardrails along the edges of any raised decks and porches. Have leaves, snow, or ice cleared regularly. Use sand, salt, or ice melter on paths if you live where there is ice and snow during the winter. Clean up any spills in your garage right away. This includes grease or oil spills. What other actions can I take? Review your medicines with your doctor. Some medicines can cause dizziness or changes in blood pressure, which increase your risk of falling. Wear shoes that: Have a low heel. Do not wear high heels. Have rubber bottoms and are closed at the toe. Feel good on your feet and fit well. Use tools that help you move around if needed. These include: Canes. Walkers. Scooters. Crutches. Ask  your doctor what else you can do to help prevent falls. This may include seeing a physical therapist to learn to do exercises to move better and get stronger. Where to find more information Centers for Disease Control and Prevention, STEADI: TonerPromos.no [employer] on Aging: BaseRingTones.pl National Institute on Aging: BaseRingTones.pl Contact a doctor if: You are afraid of falling at home. You feel weak, drowsy, or dizzy at home. You fall at home. Get help right away if you: Lose consciousness or have trouble moving after a fall. Have a fall that causes a head injury. These symptoms may be an emergency. Get help right away. Call 911. Do not wait to see if the symptoms will go away. Do not drive yourself to the hospital. This information is not intended to replace advice given to you by your health care provider. Make sure you discuss any questions you have with your health care provider. Document Revised: 11/26/2021 Document Reviewed: 11/26/2021 Elsevier Patient Education  2024 [employer].

## 2023-07-14 NOTE — Progress Notes (Signed)
 Subjective:    Patient ID: Denise Stewart, female    DOB: 11-30-1940, 83 y.o.   MRN: [id number]   Chief Complaint: annual physical   HPI:  Denise Stewart is a 83 y.o. who identifies as a female who was assigned female at birth.   Social history: Lives with: by herself- her family checks on her daily Work history: retired   Water engineer in today for follow up of the following chronic medical issues:  1. Primary hypertension No c/o chest pain, sob or headache. Does  check blood pressure at home and is usually elevtaed. BP Readings from Last 3 Encounters:  01/13/23 (!) 146/82  07/08/22 (!) 147/81  01/03/22 136/82     2. Mixed hyperlipidemia Does not really watch diet and does no exercise. Lab Results  Component Value Date   CHOL 157 01/13/2023   HDL 45 01/13/2023   LDLCALC 92 01/13/2023   TRIG 113 01/13/2023   CHOLHDL 3.5 01/13/2023     3. Acquired hypothyroidism No issues that she is aware of. Lab Results  Component Value Date   TSH 0.358 (L) 01/13/2023     4. Neuropathy Has burning in bil feet. Is on neurontin which helps  5. GAD (generalized anxiety disorder) Has klonopin to take as needed. Takes bid.    07/14/2023    2:06 PM 01/13/2023    2:27 PM 07/08/2022    2:10 PM 01/03/2022    3:40 PM  GAD 7 : Generalized Anxiety Score  Nervous, Anxious, on Edge 0 1 1 1   Control/stop worrying 0 1 1 0  Worry too much - different things 0 1 1 0  Trouble relaxing 0 0 1 0  Restless 0 0 1 0  Easily annoyed or irritable 0 0 1 0  Afraid - awful might happen 0 1 1 0  Total GAD 7 Score 0 4 7 1   Anxiety Difficulty Not difficult at all Not difficult at all Not difficult at all Not difficult at all     6. Recurrent major depressive disorder, in full remission Suppose to be on celexa which was not filled at last visit , so patient thought she wasn't suppose to take anymore. Is a worrier about everything and that has gotten worse since she has not had her  celexa.    07/14/2023     2:06 PM 04/28/2023    3:25 PM 01/13/2023    2:27 PM  Depression screen PHQ 2/9  Decreased Interest 0 1 1  Down, Depressed, Hopeless 0 0 0  PHQ - 2 Score 0 1 1  Altered sleeping  0 0  Tired, decreased energy  1 1  Change in appetite  0 0  Feeling bad or failure about yourself   0 0  Trouble concentrating  0 0  Moving slowly or fidgety/restless  0 0  Suicidal thoughts  0 0  PHQ-9 Score  2 2  Difficult doing work/chores   Not difficult at all       7. Current smoker Smokes about a pack a day- refuses low dose CT scan  8. osteoporosis Last dexascan was done 08/30/22 with t score o -3.3. is on fosamax weekly  9. BMI 25.0-25.9,adult No recent weight changes  Wt Readings from Last 3 Encounters:  07/14/23 149 lb (67.6 kg)  04/28/23 146 lb (66.2 kg)  01/13/23 146 lb (66.2 kg)   BMI Readings from Last 3 Encounters:  07/14/23 26.39 kg/m  04/28/23 25.86 kg/m  01/13/23 25.86 kg/m  New complaints: None today  Allergies  Allergen Reactions   Celexa [Citalopram Hydrobromide] Anaphylaxis, Shortness Of Breath and Swelling   Zocor [Simvastatin]     myalsia   Pravachol [Pravastatin Sodium] Rash   Outpatient Encounter Medications as of 07/14/2023  Medication Sig   alendronate (FOSAMAX) 70 MG tablet TAKE 1 TABLET WEEKLY (TAKE WITH 8OZ OF WATER 30 MINUTES BEFORE BREAKFAST)   amLODipine (NORVASC) 5 MG tablet Take 1 tablet (5 mg total) by mouth daily.   aspirin 81 MG tablet Take 81 mg by mouth daily.   calcium carbonate (OS-CAL) 600 MG TABS Take 600 mg by mouth 2 (two) times daily with a meal.   Cholecalciferol (VITAMIN D-3) 1000 UNITS CAPS Take 1,000 Units by mouth daily.   citalopram (CELEXA) 20 MG tablet Take 1 tablet (20 mg total) by mouth daily.   clonazePAM (KLONOPIN) 0.5 MG tablet TAKE ONE (1) TABLET BY MOUTH TWO (2) TIMES DAILY   fish oil-omega-3 fatty acids 1000 MG capsule Take 2 g by mouth daily.   gabapentin (NEURONTIN) 100 MG capsule Take 1 capsule (100 mg total)  by mouth 3 (three) times daily.   hydrochlorothiazide (HYDRODIURIL) 25 MG tablet Take 1 tablet (25 mg total) by mouth daily.   levothyroxine (SYNTHROID) 88 MCG tablet Take 1 tablet (88 mcg total) by mouth daily before breakfast.   lisinopril (ZESTRIL) 40 MG tablet Take 1 tablet (40 mg total) by mouth daily.   metoprolol succinate (TOPROL-XL) 100 MG 24 hr tablet Take 1 tablet (100 mg total) by mouth daily. with food   No facility-administered encounter medications on file as of 07/14/2023.    Past Surgical History:  Procedure Laterality Date   EYE SURGERY Right 1970   right -UNDER eye lesion removed     Family History  Problem Relation Age of Onset   Hypertension Mother    Heart attack Mother 77   Heart disease Mother    Clotting disorder Father 19   Stroke Brother    Suicidality Sister    Other Sister        train accident   Cancer Sister        liver cancer   Diabetes Brother    Other Brother        storm accident   Diabetes Brother        leg removed    Cancer Brother        lung   Cancer Brother        lymphoma      Controlled substance contract: 01/08/22     Review of Systems  Constitutional:  Negative for diaphoresis.  Eyes:  Negative for pain.  Respiratory:  Negative for shortness of breath.   Cardiovascular:  Negative for chest pain, palpitations and leg swelling.  Gastrointestinal:  Negative for abdominal pain.  Endocrine: Negative for polydipsia.  Skin:  Negative for rash.  Neurological:  Negative for dizziness, weakness and headaches.  Hematological:  Does not bruise/bleed easily.  All other systems reviewed and are negative.      Objective:   Physical Exam Vitals and nursing note reviewed.  Constitutional:      General: She is not in acute distress.    Appearance: Normal appearance. She is well-developed.  HENT:     Head: Normocephalic.     Right Ear: Tympanic membrane normal.     Left Ear: Tympanic membrane normal.     Nose: Nose normal.      Mouth/Throat:     Mouth:  Mucous membranes are moist.  Eyes:     Pupils: Pupils are equal, round, and reactive to light.  Neck:     Vascular: No carotid bruit or JVD.  Cardiovascular:     Rate and Rhythm: Normal rate and regular rhythm.     Heart sounds: Normal heart sounds.  Pulmonary:     Effort: Pulmonary effort is normal. No respiratory distress.     Breath sounds: Normal breath sounds. No wheezing or rales.  Chest:     Chest wall: No tenderness.  Abdominal:     General: Bowel sounds are normal. There is no distension or abdominal bruit.     Palpations: Abdomen is soft. There is no hepatomegaly, splenomegaly, mass or pulsatile mass.     Tenderness: There is no abdominal tenderness.  Musculoskeletal:        General: Normal range of motion.     Cervical back: Normal range of motion and neck supple.     Right lower leg: No edema.     Left lower leg: No edema.  Lymphadenopathy:     Cervical: No cervical adenopathy.  Skin:    General: Skin is warm and dry.  Neurological:     Mental Status: She is alert and oriented to person, place, and time.     Deep Tendon Reflexes: Reflexes are normal and symmetric.  Psychiatric:        Behavior: Behavior normal.        Thought Content: Thought content normal.        Judgment: Judgment normal.      BP (!) 163/84   Pulse 61   Temp (!) 96.7 F (35.9 C) (Temporal)   Ht 5\' 3"  (1.6 m)   Wt 149 lb (67.6 kg)   SpO2 90%   BMI 26.39 kg/m        Assessment & Plan:  Loella Hickle comes in today with chief complaint of annual physical  Diagnosis and orders addressed:  1. Primary hypertension Low sodium diet Increase norvasc to 10mg  daily- report any loer ext edema - CBC with Differential/Platelet - CMP14+EGFR - amLODipine (NORVASC) 10 MG tablet; Take 1 tablet (10 mg total) by mouth daily.  Dispense: 90 tablet; Refill: 1 - hydrochlorothiazide (HYDRODIURIL) 25 MG tablet; Take 1 tablet (25 mg total) by mouth daily.  Dispense: 90  tablet; Refill: 1 - lisinopril (ZESTRIL) 40 MG tablet; Take 1 tablet (40 mg total) by mouth daily.  Dispense: 90 tablet; Refill: 1 - metoprolol succinate (TOPROL-XL) 100 MG 24 hr tablet; Take 1 tablet (100 mg total) by mouth daily. with food  Dispense: 90 tablet; Refill: 1  2. Mixed hyperlipidemia Low fat diet - Lipid panel  3. Acquired hypothyroidism Labs pending - Thyroid Panel With TSH - levothyroxine (SYNTHROID) 88 MCG tablet; Take 1 tablet (88 mcg total) by mouth daily before breakfast.  Dispense: 90 tablet; Refill: 1  4. Neuropathy Do not go barefooted - gabapentin (NEURONTIN) 100 MG capsule; Take 1 capsule (100 mg total) by mouth 3 (three) times daily.  Dispense: 90 capsule; Refill: 5  5. GAD (generalized anxiety disorder) - ToxASSURE Select 13 (MW), Urine - clonazePAM (KLONOPIN) 0.5 MG tablet; TAKE ONE (1) TABLET BY MOUTH TWO (2) TIMES DAILY  Dispense: 60 tablet; Refill: 5  6. Recurrent major depressive disorder, in full remission Stress management - citalopram (CELEXA) 20 MG tablet; Take 1 tablet (20 mg total) by mouth daily.  Dispense: 90 tablet; Refill: 1  7. Current smoker Smoking cessation encouraged  8.  BMI 27.0-27.9,adult Discussed diet and exercise for person with BMI >25 Will recheck weight in 3-6 months     Labs pending Health Maintenance reviewed Diet and exercise encouraged  Follow up plan: 6 months   Mary-Margaret Daphine Deutscher, FNP

## 2023-07-15 DIAGNOSIS — S52502A Unspecified fracture of the lower end of left radius, initial encounter for closed fracture: Secondary | ICD-10-CM | POA: Diagnosis not present

## 2023-07-15 DIAGNOSIS — M19032 Primary osteoarthritis, left wrist: Secondary | ICD-10-CM | POA: Diagnosis not present

## 2023-07-15 DIAGNOSIS — M8580 Other specified disorders of bone density and structure, unspecified site: Secondary | ICD-10-CM | POA: Diagnosis not present

## 2023-07-15 DIAGNOSIS — M25532 Pain in left wrist: Secondary | ICD-10-CM | POA: Diagnosis not present

## 2023-07-15 LAB — CBC WITH DIFFERENTIAL/PLATELET
Basophils Absolute: 0.1 10*3/uL (ref 0.0–0.2)
Basos: 1 %
EOS (ABSOLUTE): 0.1 10*3/uL (ref 0.0–0.4)
Eos: 1 %
Hematocrit: 44.7 % (ref 34.0–46.6)
Hemoglobin: 14.5 g/dL (ref 11.1–15.9)
Immature Grans (Abs): 0 10*3/uL (ref 0.0–0.1)
Immature Granulocytes: 0 %
Lymphocytes Absolute: 2.1 10*3/uL (ref 0.7–3.1)
Lymphs: 27 %
MCH: 30.3 pg (ref 26.6–33.0)
MCHC: 32.4 g/dL (ref 31.5–35.7)
MCV: 93 fL (ref 79–97)
Monocytes Absolute: 0.7 10*3/uL (ref 0.1–0.9)
Monocytes: 9 %
Neutrophils Absolute: 4.7 10*3/uL (ref 1.4–7.0)
Neutrophils: 62 %
Platelets: 222 10*3/uL (ref 150–450)
RBC: 4.79 x10E6/uL (ref 3.77–5.28)
RDW: 12.4 % (ref 11.7–15.4)
WBC: 7.7 10*3/uL (ref 3.4–10.8)

## 2023-07-15 LAB — LIPID PANEL
Cholesterol, Total: 166 mg/dL (ref 100–199)
HDL: 46 mg/dL (ref >39)
LDL CALC COMMENT:: 3.6 ratio (ref 0.0–4.4)
LDL Chol Calc (NIH): 98 mg/dL (ref 0–99)
Triglycerides: 122 mg/dL (ref 0–149)
VLDL Cholesterol Cal: 22 mg/dL (ref 5–40)

## 2023-07-15 LAB — CMP14+EGFR
ALT: 9 IU/L (ref 0–32)
AST: 19 IU/L (ref 0–40)
Albumin: 4.3 g/dL (ref 3.7–4.7)
Alkaline Phosphatase: 96 IU/L (ref 44–121)
BUN/Creatinine Ratio: 12 (ref 12–28)
BUN: 15 mg/dL (ref 8–27)
Bilirubin Total: 0.5 mg/dL (ref 0.0–1.2)
CO2: 19 mmol/L — ABNORMAL LOW (ref 20–29)
Calcium: 9.5 mg/dL (ref 8.7–10.3)
Chloride: 99 mmol/L (ref 96–106)
Creatinine, Ser: 1.26 mg/dL — ABNORMAL HIGH (ref 0.57–1.00)
Globulin, Total: 3.1 g/dL (ref 1.5–4.5)
Glucose: 95 mg/dL (ref 70–99)
Potassium: 4.7 mmol/L (ref 3.5–5.2)
Sodium: 141 mmol/L (ref 134–144)
Total Protein: 7.4 g/dL (ref 6.0–8.5)
eGFR: 42 mL/min/{1.73_m2} — ABNORMAL LOW (ref >59)

## 2023-07-15 LAB — THYROID PANEL WITH TSH
Free Thyroxine Index: 3 (ref 1.2–4.9)
T3 Uptake Ratio: 28 % (ref 24–39)
T4, Total: 10.6 ug/dL (ref 4.5–12.0)
TSH: 0.662 u[IU]/mL (ref 0.450–4.500)

## 2023-07-15 LAB — VITAMIN D 25 HYDROXY (VIT D DEFICIENCY, FRACTURES): Vit D, 25-Hydroxy: 47.5 ng/mL (ref 30.0–100.0)

## 2023-07-16 LAB — TOXASSURE SELECT 13 (MW), URINE

## 2023-07-21 DIAGNOSIS — S52552A Other extraarticular fracture of lower end of left radius, initial encounter for closed fracture: Secondary | ICD-10-CM | POA: Diagnosis not present

## 2023-07-21 DIAGNOSIS — Y92008 Other place in unspecified non-institutional (private) residence as the place of occurrence of the external cause: Secondary | ICD-10-CM | POA: Diagnosis not present

## 2023-07-21 DIAGNOSIS — W1830XA Fall on same level, unspecified, initial encounter: Secondary | ICD-10-CM | POA: Diagnosis not present

## 2023-07-21 DIAGNOSIS — M25532 Pain in left wrist: Secondary | ICD-10-CM | POA: Diagnosis not present

## 2023-07-21 DIAGNOSIS — Y9301 Activity, walking, marching and hiking: Secondary | ICD-10-CM | POA: Diagnosis not present

## 2023-07-28 DIAGNOSIS — S52552D Other extraarticular fracture of lower end of left radius, subsequent encounter for closed fracture with routine healing: Secondary | ICD-10-CM | POA: Diagnosis not present

## 2023-08-07 ENCOUNTER — Ambulatory Visit

## 2023-08-07 DIAGNOSIS — E538 Deficiency of other specified B group vitamins: Secondary | ICD-10-CM

## 2023-08-07 MED ORDER — CYANOCOBALAMIN 1000 MCG/ML IJ SOLN
1000.0000 ug | INTRAMUSCULAR | Status: AC
  Administered 2023-08-07 – 2024-04-23 (×8): 1000 ug via INTRAMUSCULAR

## 2023-08-07 NOTE — Progress Notes (Signed)
 Patient is in office today for a nurse visit for B12 Injection. Patient Injection was given in the  Right deltoid. Patient tolerated injection well.

## 2023-08-11 DIAGNOSIS — S52552D Other extraarticular fracture of lower end of left radius, subsequent encounter for closed fracture with routine healing: Secondary | ICD-10-CM | POA: Diagnosis not present

## 2023-08-27 DIAGNOSIS — S52552D Other extraarticular fracture of lower end of left radius, subsequent encounter for closed fracture with routine healing: Secondary | ICD-10-CM | POA: Diagnosis not present

## 2023-09-08 ENCOUNTER — Ambulatory Visit (INDEPENDENT_AMBULATORY_CARE_PROVIDER_SITE_OTHER): Admitting: *Deleted

## 2023-09-08 DIAGNOSIS — E538 Deficiency of other specified B group vitamins: Secondary | ICD-10-CM

## 2023-09-08 NOTE — Progress Notes (Signed)
 Patient is in office today for a nurse visit for B12 Injection. Patient Injection was given in the  Left arm. Patient tolerated injection well.

## 2023-09-11 ENCOUNTER — Other Ambulatory Visit: Payer: Self-pay | Admitting: Nurse Practitioner

## 2023-09-11 DIAGNOSIS — I10 Essential (primary) hypertension: Secondary | ICD-10-CM

## 2023-09-16 DIAGNOSIS — M25632 Stiffness of left wrist, not elsewhere classified: Secondary | ICD-10-CM | POA: Diagnosis not present

## 2023-09-16 DIAGNOSIS — R29898 Other symptoms and signs involving the musculoskeletal system: Secondary | ICD-10-CM | POA: Diagnosis not present

## 2023-09-16 DIAGNOSIS — S52552D Other extraarticular fracture of lower end of left radius, subsequent encounter for closed fracture with routine healing: Secondary | ICD-10-CM | POA: Diagnosis not present

## 2023-10-01 DIAGNOSIS — S52552D Other extraarticular fracture of lower end of left radius, subsequent encounter for closed fracture with routine healing: Secondary | ICD-10-CM | POA: Diagnosis not present

## 2023-10-08 ENCOUNTER — Ambulatory Visit (INDEPENDENT_AMBULATORY_CARE_PROVIDER_SITE_OTHER): Admitting: *Deleted

## 2023-10-08 DIAGNOSIS — E538 Deficiency of other specified B group vitamins: Secondary | ICD-10-CM

## 2023-10-08 NOTE — Patient Instructions (Signed)
B12 injection given right deltoid intramuscular. Patient tolerated well.

## 2023-11-14 ENCOUNTER — Other Ambulatory Visit

## 2023-11-14 ENCOUNTER — Ambulatory Visit (INDEPENDENT_AMBULATORY_CARE_PROVIDER_SITE_OTHER): Admitting: *Deleted

## 2023-11-14 DIAGNOSIS — E538 Deficiency of other specified B group vitamins: Secondary | ICD-10-CM | POA: Diagnosis not present

## 2023-11-14 NOTE — Progress Notes (Signed)
 Patient is in office today for a nurse visit for B12 Injection. Patient Injection was given in the  Left deltoid. Patient tolerated injection well.

## 2023-11-20 ENCOUNTER — Ambulatory Visit: Payer: Self-pay | Admitting: Nurse Practitioner

## 2023-11-20 LAB — VITAMIN B1: Thiamine: 128.2 nmol/L (ref 66.5–200.0)

## 2023-12-01 ENCOUNTER — Other Ambulatory Visit: Payer: Self-pay | Admitting: Nurse Practitioner

## 2023-12-01 DIAGNOSIS — E039 Hypothyroidism, unspecified: Secondary | ICD-10-CM

## 2023-12-04 ENCOUNTER — Telehealth: Payer: Self-pay

## 2023-12-04 ENCOUNTER — Ambulatory Visit: Payer: Self-pay

## 2023-12-04 DIAGNOSIS — E538 Deficiency of other specified B group vitamins: Secondary | ICD-10-CM

## 2023-12-04 NOTE — Telephone Encounter (Signed)
 FYI Only or Action Required?: FYI only for provider.  Patient was last seen in primary care on 07/14/2023 by Gladis Mustard, FNP.  Called Nurse Triage reporting No chief complaint on file..  Symptoms began today.  Interventions attempted: Nothing.  Symptoms are: not sure.  Triage Disposition: Call PCP When Office is Open  Patient/caregiver understands and will follow disposition?: YesCopied from CRM [id number]. Topic: Clinical - Request for Lab/Test Order >> Dec 04, 2023  3:24 PM Tiffany B wrote: Reason for CRM: Most recent b12 lab results Reason for Disposition  [1] Caller requesting NON-URGENT health information AND [2] PCP's office is the best resource  Answer Assessment - Initial Assessment Questions 1. REASON FOR CALL: What is the main reason for your call? or How can I best help you?    Daughter kyra called to find out lab results on vit b12. No labs posted. Please advise  Protocols used: Information Only Call - No Triage-A-AH

## 2023-12-04 NOTE — Telephone Encounter (Signed)
 Called and spoke with patients sister Kyra. She said that patient was wondering what her B12 level was because blood was drawn for that at last B12 injection appt. Looks like a B1 was ordered by mistake. Patient notified and advised that when she comes back to office in a couple weeks for B12 injection that we will draw her labs at that time. Patient and sister verbalized understanding

## 2023-12-04 NOTE — Telephone Encounter (Signed)
 Copied from CRM #[id number]. Topic: Clinical - Lab/Test Results >> Dec 04, 2023  3:31 PM Tiffany B wrote: Reason for CRM: Caller wanted to know the status of her mother B12 taking on 8/8, caller would like a call back prior to 3pm tomorrow.

## 2023-12-05 NOTE — Telephone Encounter (Signed)
 Spoke with daughter and confirmed with her that patient will have lab appt on 9/9 when she comes in for her next B12 shot, per notes, to have her B12 checked. Daughter voiced understanding.

## 2023-12-05 NOTE — Telephone Encounter (Signed)
 Left message for daughter to call back with more info on what exactly she needs to know about her moms B12 appt that she had on 8/8.

## 2023-12-16 ENCOUNTER — Ambulatory Visit (INDEPENDENT_AMBULATORY_CARE_PROVIDER_SITE_OTHER): Admitting: *Deleted

## 2023-12-16 DIAGNOSIS — E538 Deficiency of other specified B group vitamins: Secondary | ICD-10-CM

## 2023-12-16 NOTE — Progress Notes (Signed)
 Patient is in office today for a nurse visit for B12 Injection. Patient Injection was given in the  Right deltoid. Patient tolerated injection well.

## 2023-12-17 LAB — VITAMIN B12: Vitamin B-12: 624 pg/mL (ref 232–1245)

## 2023-12-18 ENCOUNTER — Ambulatory Visit: Payer: Self-pay | Admitting: Nurse Practitioner

## 2023-12-22 ENCOUNTER — Encounter: Payer: Self-pay | Admitting: Nurse Practitioner

## 2023-12-22 ENCOUNTER — Ambulatory Visit (INDEPENDENT_AMBULATORY_CARE_PROVIDER_SITE_OTHER): Admitting: Nurse Practitioner

## 2023-12-22 VITALS — BP 118/53 | HR 60 | Temp 97.1°F | Ht 63.0 in | Wt 148.0 lb

## 2023-12-22 DIAGNOSIS — R531 Weakness: Secondary | ICD-10-CM | POA: Diagnosis not present

## 2023-12-22 DIAGNOSIS — R600 Localized edema: Secondary | ICD-10-CM

## 2023-12-22 MED ORDER — FUROSEMIDE 20 MG PO TABS: 20.0000 mg | ORAL_TABLET | Freq: Every day | ORAL | 3 refills | Status: DC

## 2023-12-22 NOTE — Progress Notes (Addendum)
   Subjective:    Patient ID: Denise Stewart, female    DOB: [birth year], 83 y.o.   MRN: [id number]   Chief Complaint: bilateral ankle swelling (2-3 weeks/)   HPI  Patient come sin today c/o swelling  bil ankles and legs. Resolves at night while sleeping. Says she props them up when siting.  Stays weak. Needs a walker  for balance and allow her to do ADL's  Patient Active Problem List   Diagnosis Date Noted   Osteoporosis without current pathological fracture 11/07/2022   Closed fracture of neck of humerus with routine healing 11/29/2019   Neuropathy 10/21/2018   Recurrent major depressive disorder, in full remission (HCC) 10/21/2018   Current smoker 02/03/2018   BMI 27.0-27.9,adult 09/04/2017   Hypertension 09/23/2012   Hyperlipidemia 09/23/2012   Hypothyroidism 09/23/2012   GAD (generalized anxiety disorder) 09/23/2012       Review of Systems  Respiratory:  Negative for shortness of breath.   Cardiovascular:  Positive for leg swelling. Negative for chest pain and palpitations.       Objective:   Physical Exam Constitutional:      Appearance: Normal appearance.  Cardiovascular:     Rate and Rhythm: Normal rate and regular rhythm.     Heart sounds: Normal heart sounds.  Pulmonary:     Breath sounds: Normal breath sounds.  Musculoskeletal:     Right lower leg: Edema (1+) present.     Left lower leg: Edema (1+) present.  Neurological:     General: No focal deficit present.     Mental Status: She is alert and oriented to person, place, and time.  Psychiatric:        Mood and Affect: Mood normal.        Behavior: Behavior normal.     BP (!) 118/53   Pulse 60   Temp (!) 97.1 F (36.2 C) (Temporal)   Ht 5' 3 (1.6 m)   Wt 148 lb (67.1 kg)   SpO2 93%   BMI 26.22 kg/m        Assessment & Plan:   Denise Stewart in today with chief complaint of bilateral ankle swelling (2-3 weeks/)   1. Peripheral edema (Primary) Stop hydrochlorothiazide  Start lasix   daily Keep check of blood pressure at home RTO prn - furosemide  (LASIX ) 20 MG tablet; Take 1 tablet (20 mg total) by mouth daily.  Dispense: 30 tablet; Refill: 3  2. Weakness Order for walker with seat on it- to help patient continue ADL's independently  The above assessment and management plan was discussed with the patient. The patient verbalized understanding of and has agreed to the management plan. Patient is aware to call the clinic if symptoms persist or worsen. Patient is aware when to return to the clinic for a follow-up visit. Patient educated on when it is appropriate to go to the emergency department.   Mary-Margaret Gladis, FNP

## 2023-12-22 NOTE — Addendum Note (Signed)
 Addended by: GLADIS MUSTARD on: 12/22/2023 12:31 PM   Modules accepted: Orders

## 2023-12-22 NOTE — Patient Instructions (Signed)
 Peripheral Edema  Peripheral edema is swelling that is caused by a buildup of fluid. Peripheral edema most often affects the lower legs, ankles, and feet. It can also develop in the arms, hands, and face. The area of the body that has peripheral edema will look swollen. It may also feel heavy or warm. Your clothes may start to feel tight. Pressing on the area may make a temporary dent in your skin (pitting edema). You may not be able to move your swollen arm or leg as much as usual. There are many causes of peripheral edema. It can happen because of a complication of other conditions such as heart failure, kidney disease, or a problem with your circulation. It also can be a side effect of certain medicines or happen because of an infection. It often happens to women during pregnancy. Sometimes, the cause is not known. Follow these instructions at home: Managing pain, stiffness, and swelling  Raise (elevate) your legs while you are sitting or lying down. Move around often to prevent stiffness and to reduce swelling. Do not sit or stand for long periods of time. Do not wear tight clothing. Do not wear garters on your upper legs. Exercise your legs to get your circulation going. This helps to move the fluid back into your blood vessels, and it may help the swelling go down. Wear compression stockings as told by your health care provider. These stockings help to prevent blood clots and reduce swelling in your legs. It is important that these are the correct size. These stockings should be prescribed by your doctor to prevent possible injuries. If elastic bandages or wraps are recommended, use them as told by your health care provider. Medicines Take over-the-counter and prescription medicines only as told by your health care provider. Your health care provider may prescribe medicine to help your body get rid of excess water (diuretic). Take this medicine if you are told to take it. General  instructions Eat a low-salt (low-sodium) diet as told by your health care provider. Sometimes, eating less salt may reduce swelling. Pay attention to any changes in your symptoms. Moisturize your skin daily to help prevent skin from cracking and draining. Keep all follow-up visits. This is important. Contact a health care provider if: You have a fever. You have swelling in only one leg. You have increased swelling, redness, or pain in one or both of your legs. You have drainage or sores at the area where you have edema. Get help right away if: You have edema that starts suddenly or is getting worse, especially if you are pregnant or have a medical condition. You develop shortness of breath, especially when you are lying down. You have pain in your chest or abdomen. You feel weak. You feel like you will faint. These symptoms may be an emergency. Get help right away. Call 911. Do not wait to see if the symptoms will go away. Do not drive yourself to the hospital. Summary Peripheral edema is swelling that is caused by a buildup of fluid. Peripheral edema most often affects the lower legs, ankles, and feet. Move around often to prevent stiffness and to reduce swelling. Do not sit or stand for long periods of time. Pay attention to any changes in your symptoms. Contact a health care provider if you have edema that starts suddenly or is getting worse, especially if you are pregnant or have a medical condition. Get help right away if you develop shortness of breath, especially when lying down.  This information is not intended to replace advice given to you by your health care provider. Make sure you discuss any questions you have with your health care provider. Document Revised: 11/27/2020 Document Reviewed: 11/27/2020 Elsevier Patient Education  2024 [employer].

## 2024-01-16 ENCOUNTER — Ambulatory Visit

## 2024-01-16 ENCOUNTER — Other Ambulatory Visit: Payer: Self-pay | Admitting: Nurse Practitioner

## 2024-01-16 DIAGNOSIS — Z780 Asymptomatic menopausal state: Secondary | ICD-10-CM

## 2024-01-16 DIAGNOSIS — Z13820 Encounter for screening for osteoporosis: Secondary | ICD-10-CM

## 2024-01-19 ENCOUNTER — Encounter: Payer: Self-pay | Admitting: Nurse Practitioner

## 2024-01-19 ENCOUNTER — Other Ambulatory Visit: Payer: Self-pay | Admitting: Nurse Practitioner

## 2024-01-19 ENCOUNTER — Ambulatory Visit (INDEPENDENT_AMBULATORY_CARE_PROVIDER_SITE_OTHER)

## 2024-01-19 ENCOUNTER — Ambulatory Visit (INDEPENDENT_AMBULATORY_CARE_PROVIDER_SITE_OTHER): Admitting: Nurse Practitioner

## 2024-01-19 DIAGNOSIS — M810 Age-related osteoporosis without current pathological fracture: Secondary | ICD-10-CM

## 2024-01-19 DIAGNOSIS — E039 Hypothyroidism, unspecified: Secondary | ICD-10-CM

## 2024-01-19 DIAGNOSIS — Z7983 Long term (current) use of bisphosphonates: Secondary | ICD-10-CM

## 2024-01-19 DIAGNOSIS — Z780 Asymptomatic menopausal state: Secondary | ICD-10-CM

## 2024-01-19 DIAGNOSIS — F411 Generalized anxiety disorder: Secondary | ICD-10-CM

## 2024-01-19 DIAGNOSIS — Z13820 Encounter for screening for osteoporosis: Secondary | ICD-10-CM

## 2024-01-19 DIAGNOSIS — I10 Essential (primary) hypertension: Secondary | ICD-10-CM | POA: Diagnosis not present

## 2024-01-19 DIAGNOSIS — F3342 Major depressive disorder, recurrent, in full remission: Secondary | ICD-10-CM | POA: Diagnosis not present

## 2024-01-19 DIAGNOSIS — G629 Polyneuropathy, unspecified: Secondary | ICD-10-CM | POA: Diagnosis not present

## 2024-01-19 DIAGNOSIS — Z9189 Other specified personal risk factors, not elsewhere classified: Secondary | ICD-10-CM

## 2024-01-19 DIAGNOSIS — R600 Localized edema: Secondary | ICD-10-CM

## 2024-01-19 MED ORDER — METOPROLOL SUCCINATE ER 100 MG PO TB24: 100.0000 mg | ORAL_TABLET | Freq: Every day | ORAL | 1 refills | Status: AC

## 2024-01-19 MED ORDER — LISINOPRIL 40 MG PO TABS: 40.0000 mg | ORAL_TABLET | Freq: Every day | ORAL | 1 refills | Status: AC

## 2024-01-19 MED ORDER — CITALOPRAM HYDROBROMIDE 20 MG PO TABS: 20.0000 mg | ORAL_TABLET | Freq: Every day | ORAL | 1 refills | Status: AC

## 2024-01-19 MED ORDER — LEVOTHYROXINE SODIUM 88 MCG PO TABS: 88.0000 ug | ORAL_TABLET | Freq: Every day | ORAL | 1 refills | Status: AC

## 2024-01-19 MED ORDER — GABAPENTIN 100 MG PO CAPS: 100.0000 mg | ORAL_CAPSULE | Freq: Three times a day (TID) | ORAL | 5 refills | Status: AC

## 2024-01-19 MED ORDER — AMLODIPINE BESYLATE 10 MG PO TABS: 10.0000 mg | ORAL_TABLET | Freq: Every day | ORAL | 1 refills | Status: AC

## 2024-01-19 MED ORDER — ALENDRONATE SODIUM 70 MG PO TABS: 70.0000 mg | ORAL_TABLET | ORAL | 1 refills | Status: AC

## 2024-01-19 MED ORDER — FUROSEMIDE 20 MG PO TABS: 20.0000 mg | ORAL_TABLET | Freq: Every day | ORAL | 1 refills | Status: AC

## 2024-01-19 MED ORDER — CLONAZEPAM 0.5 MG PO TABS
ORAL_TABLET | ORAL | 5 refills | Status: AC
Start: 2024-01-19 — End: ?

## 2024-01-19 NOTE — Patient Instructions (Signed)
 Peripheral Edema  Peripheral edema is swelling that is caused by a buildup of fluid. Peripheral edema most often affects the lower legs, ankles, and feet. It can also develop in the arms, hands, and face. The area of the body that has peripheral edema will look swollen. It may also feel heavy or warm. Your clothes may start to feel tight. Pressing on the area may make a temporary dent in your skin (pitting edema). You may not be able to move your swollen arm or leg as much as usual. There are many causes of peripheral edema. It can happen because of a complication of other conditions such as heart failure, kidney disease, or a problem with your circulation. It also can be a side effect of certain medicines or happen because of an infection. It often happens to women during pregnancy. Sometimes, the cause is not known. Follow these instructions at home: Managing pain, stiffness, and swelling  Raise (elevate) your legs while you are sitting or lying down. Move around often to prevent stiffness and to reduce swelling. Do not sit or stand for long periods of time. Do not wear tight clothing. Do not wear garters on your upper legs. Exercise your legs to get your circulation going. This helps to move the fluid back into your blood vessels, and it may help the swelling go down. Wear compression stockings as told by your health care provider. These stockings help to prevent blood clots and reduce swelling in your legs. It is important that these are the correct size. These stockings should be prescribed by your doctor to prevent possible injuries. If elastic bandages or wraps are recommended, use them as told by your health care provider. Medicines Take over-the-counter and prescription medicines only as told by your health care provider. Your health care provider may prescribe medicine to help your body get rid of excess water (diuretic). Take this medicine if you are told to take it. General  instructions Eat a low-salt (low-sodium) diet as told by your health care provider. Sometimes, eating less salt may reduce swelling. Pay attention to any changes in your symptoms. Moisturize your skin daily to help prevent skin from cracking and draining. Keep all follow-up visits. This is important. Contact a health care provider if: You have a fever. You have swelling in only one leg. You have increased swelling, redness, or pain in one or both of your legs. You have drainage or sores at the area where you have edema. Get help right away if: You have edema that starts suddenly or is getting worse, especially if you are pregnant or have a medical condition. You develop shortness of breath, especially when you are lying down. You have pain in your chest or abdomen. You feel weak. You feel like you will faint. These symptoms may be an emergency. Get help right away. Call 911. Do not wait to see if the symptoms will go away. Do not drive yourself to the hospital. Summary Peripheral edema is swelling that is caused by a buildup of fluid. Peripheral edema most often affects the lower legs, ankles, and feet. Move around often to prevent stiffness and to reduce swelling. Do not sit or stand for long periods of time. Pay attention to any changes in your symptoms. Contact a health care provider if you have edema that starts suddenly or is getting worse, especially if you are pregnant or have a medical condition. Get help right away if you develop shortness of breath, especially when lying down.  This information is not intended to replace advice given to you by your health care provider. Make sure you discuss any questions you have with your health care provider. Document Revised: 11/27/2020 Document Reviewed: 11/27/2020 Elsevier Patient Education  2024 [employer].

## 2024-01-19 NOTE — Progress Notes (Signed)
 Subjective:    Patient ID: Denise Stewart, female    DOB: 06/08/1940, 83 y.o.   MRN: [id number]   Chief Complaint: medical management of chronic issues    HPI:  Denise Stewart is a 83 y.o. who identifies as a female who was assigned female at birth.   Social history: Lives with: by herself- her family checks on her daily Work history: retired   Water engineer in today for follow up of the following chronic medical issues:  1. Primary hypertension No c/o chest pain, sob or headache. Does  check blood pressure at home and is usually elevtaed. BP Readings from Last 3 Encounters:  01/19/24 135/78  12/22/23 (!) 118/53  07/14/23 (!) 163/84     2. Mixed hyperlipidemia Does not really watch diet and does no exercise. Lab Results  Component Value Date   CHOL 166 07/14/2023   HDL 46 07/14/2023   LDLCALC 98 07/14/2023   TRIG 122 07/14/2023   CHOLHDL 3.6 07/14/2023     3. Acquired hypothyroidism No issues that she is aware of. Lab Results  Component Value Date   TSH 0.662 07/14/2023     4. Neuropathy Has burning in bil feet. Is on neurontin  which helps  5. GAD (generalized anxiety disorder) Has klonopin  to take as needed. Takes bid.    01/19/2024    2:05 PM 12/22/2023   12:12 PM 07/14/2023    2:06 PM 01/13/2023    2:27 PM  GAD 7 : Generalized Anxiety Score  Nervous, Anxious, on Edge 0 0 0 1  Control/stop worrying 0 0 0 1  Worry too much - different things 0 0 0 1  Trouble relaxing 0 0 0 0  Restless 0 0 0 0  Easily annoyed or irritable 0 0 0 0  Afraid - awful might happen 0 0 0 1  Total GAD 7 Score 0 0 0 4  Anxiety Difficulty Not difficult at all Not difficult at all Not difficult at all Not difficult at all     6. Recurrent major depressive disorder, in full remission Suppose to be on celexa  which was not filled at last visit , so patient thought she wasn't suppose to take anymore. Is a worrier about everything and that has gotten worse since she has not had her   celexa .    01/19/2024    2:05 PM 12/22/2023   12:12 PM 07/14/2023    2:06 PM  Depression screen PHQ 2/9  Decreased Interest 0 0 0  Down, Depressed, Hopeless 0 0 0  PHQ - 2 Score 0 0 0  Altered sleeping  0   Tired, decreased energy  0   Change in appetite  0   Feeling bad or failure about yourself   0   Trouble concentrating  0   Moving slowly or fidgety/restless  0   Suicidal thoughts  0   PHQ-9 Score  0   Difficult doing work/chores  Not difficult at all        7. Current smoker Smokes about a pack a day- refuses low dose CT scan  8. osteoporosis Last dexascan was done 08/30/22 with t score o -3.3. is on fosamax  weekly  9. BMI 25.0-25.9,adult No recent weight changes  Wt Readings from Last 3 Encounters:  01/19/24 147 lb (66.7 kg)  12/22/23 148 lb (67.1 kg)  07/14/23 149 lb (67.6 kg)   BMI Readings from Last 3 Encounters:  01/19/24 26.04 kg/m  12/22/23 26.22 kg/m  07/14/23 26.39 kg/m  New complaints: None today  Allergies  Allergen Reactions   Celexa  [Citalopram  Hydrobromide] Anaphylaxis, Shortness Of Breath and Swelling   Zocor [Simvastatin]     myalsia   Pravachol [Pravastatin Sodium] Rash   Outpatient Encounter Medications as of 01/19/2024  Medication Sig   alendronate  (FOSAMAX ) 70 MG tablet Take 1 tablet (70 mg total) by mouth once a week. Take with a full glass of water on an empty stomach.   amLODipine  (NORVASC ) 10 MG tablet Take 1 tablet (10 mg total) by mouth daily.   aspirin 81 MG tablet Take 81 mg by mouth daily.   calcium carbonate (OS-CAL) 600 MG TABS Take 600 mg by mouth 2 (two) times daily with a meal.   Cholecalciferol (VITAMIN D -3) 1000 UNITS CAPS Take 1,000 Units by mouth daily.   citalopram  (CELEXA ) 20 MG tablet Take 1 tablet (20 mg total) by mouth daily.   clonazePAM  (KLONOPIN ) 0.5 MG tablet TAKE ONE (1) TABLET BY MOUTH TWO (2) TIMES DAILY   fish oil-omega-3 fatty acids 1000 MG capsule Take 2 g by mouth daily.   furosemide   (LASIX ) 20 MG tablet Take 1 tablet (20 mg total) by mouth daily.   gabapentin  (NEURONTIN ) 100 MG capsule Take 1 capsule (100 mg total) by mouth 3 (three) times daily.   levothyroxine  (SYNTHROID ) 88 MCG tablet TAKE 1 TABLET BY MOUTH EVERY MORNING WITH BREAKFAST   lisinopril  (ZESTRIL ) 40 MG tablet Take 1 tablet (40 mg total) by mouth daily.   metoprolol  succinate (TOPROL -XL) 100 MG 24 hr tablet Take 1 tablet (100 mg total) by mouth daily. with food   Facility-Administered Encounter Medications as of 01/19/2024  Medication   cyanocobalamin  (VITAMIN B12) injection 1,000 mcg    Past Surgical History:  Procedure Laterality Date   EYE SURGERY Right 1970   right -UNDER eye lesion removed     Family History  Problem Relation Age of Onset   Hypertension Mother    Heart attack Mother 30   Heart disease Mother    Clotting disorder Father 5   Stroke Brother    Suicidality Sister    Other Sister        train accident   Cancer Sister        liver cancer   Diabetes Brother    Other Brother        storm accident   Diabetes Brother        leg removed    Cancer Brother        lung   Cancer Brother        lymphoma      Controlled substance contract: 01/08/22     Review of Systems  Constitutional:  Negative for diaphoresis.  Eyes:  Negative for pain.  Respiratory:  Negative for shortness of breath.   Cardiovascular:  Negative for chest pain, palpitations and leg swelling.  Gastrointestinal:  Negative for abdominal pain.  Endocrine: Negative for polydipsia.  Skin:  Negative for rash.  Neurological:  Negative for dizziness, weakness and headaches.  Hematological:  Does not bruise/bleed easily.  All other systems reviewed and are negative.      Objective:   Physical Exam Vitals and nursing note reviewed.  Constitutional:      General: She is not in acute distress.    Appearance: Normal appearance. She is well-developed.  HENT:     Head: Normocephalic.     Right Ear:  Tympanic membrane normal.     Left Ear: Tympanic membrane normal.  Nose: Nose normal.     Mouth/Throat:     Mouth: Mucous membranes are moist.  Eyes:     Pupils: Pupils are equal, round, and reactive to light.  Neck:     Vascular: No carotid bruit or JVD.  Cardiovascular:     Rate and Rhythm: Normal rate and regular rhythm.     Heart sounds: Normal heart sounds.  Pulmonary:     Effort: Pulmonary effort is normal. No respiratory distress.     Breath sounds: Normal breath sounds. No wheezing or rales.  Chest:     Chest wall: No tenderness.  Abdominal:     General: Bowel sounds are normal. There is no distension or abdominal bruit.     Palpations: Abdomen is soft. There is no hepatomegaly, splenomegaly, mass or pulsatile mass.     Tenderness: There is no abdominal tenderness.  Musculoskeletal:        General: Normal range of motion.     Cervical back: Normal range of motion and neck supple.     Right lower leg: Edema (1+) present.     Left lower leg: No edema (1+).  Lymphadenopathy:     Cervical: No cervical adenopathy.  Skin:    General: Skin is warm and dry.  Neurological:     Mental Status: She is alert and oriented to person, place, and time.     Deep Tendon Reflexes: Reflexes are normal and symmetric.  Psychiatric:        Behavior: Behavior normal.        Thought Content: Thought content normal.        Judgment: Judgment normal.      BP 135/78   Pulse 60   Temp 98.1 F (36.7 C) (Temporal)   Ht 5' 3 (1.6 m)   Wt 147 lb (66.7 kg)   SpO2 90%   BMI 26.04 kg/m        Assessment & Plan:  Nasrin Lanzo comes in today with chief complaint of annual physical  Diagnosis and orders addressed:  1. Primary hypertension Low sodium diet Increase norvasc  to 10mg  daily- report any loer ext edema - CBC with Differential/Platelet - CMP14+EGFR - amLODipine  (NORVASC ) 10 MG tablet; Take 1 tablet (10 mg total) by mouth daily.  Dispense: 90 tablet; Refill: 1 -  hydrochlorothiazide  (HYDRODIURIL ) 25 MG tablet; Take 1 tablet (25 mg total) by mouth daily.  Dispense: 90 tablet; Refill: 1 - lisinopril  (ZESTRIL ) 40 MG tablet; Take 1 tablet (40 mg total) by mouth daily.  Dispense: 90 tablet; Refill: 1 - metoprolol  succinate (TOPROL -XL) 100 MG 24 hr tablet; Take 1 tablet (100 mg total) by mouth daily. with food  Dispense: 90 tablet; Refill: 1  2. Mixed hyperlipidemia Low fat diet - Lipid panel  3. Acquired hypothyroidism Labs pending - Thyroid  Panel With TSH - levothyroxine  (SYNTHROID ) 88 MCG tablet; Take 1 tablet (88 mcg total) by mouth daily before breakfast.  Dispense: 90 tablet; Refill: 1  4. Neuropathy Do not go barefooted - gabapentin  (NEURONTIN ) 100 MG capsule; Take 1 capsule (100 mg total) by mouth 3 (three) times daily.  Dispense: 90 capsule; Refill: 5  5. GAD (generalized anxiety disorder) - ToxASSURE Select 13 (MW), Urine - clonazePAM  (KLONOPIN ) 0.5 MG tablet; TAKE ONE (1) TABLET BY MOUTH TWO (2) TIMES DAILY  Dispense: 60 tablet; Refill: 5  6. Recurrent major depressive disorder, in full remission Stress management - citalopram  (CELEXA ) 20 MG tablet; Take 1 tablet (20 mg total) by mouth daily.  Dispense:  90 tablet; Refill: 1  7. Current smoker Smoking cessation encouraged  8. BMI 27.0-27.9,adult Discussed diet and exercise for person with BMI >25 Will recheck weight in 3-6 months     Labs pending Health Maintenance reviewed Diet and exercise encouraged  Follow up plan: 6 months   Mary-Margaret Gladis, FNP

## 2024-01-19 NOTE — Addendum Note (Signed)
 Addended by: GLADIS MUSTARD on: 01/19/2024 02:18 PM   Modules accepted: Level of Service

## 2024-01-20 ENCOUNTER — Ambulatory Visit: Payer: Self-pay | Admitting: Nurse Practitioner

## 2024-01-20 DIAGNOSIS — N1831 Chronic kidney disease, stage 3a: Secondary | ICD-10-CM | POA: Insufficient documentation

## 2024-01-20 LAB — CBC WITH DIFFERENTIAL/PLATELET
Basophils Absolute: 0.1 x10E3/uL (ref 0.0–0.2)
Basos: 1 %
EOS (ABSOLUTE): 0.1 x10E3/uL (ref 0.0–0.4)
Eos: 1 %
Hematocrit: 43.5 % (ref 34.0–46.6)
Hemoglobin: 14.3 g/dL (ref 11.1–15.9)
Immature Grans (Abs): 0 x10E3/uL (ref 0.0–0.1)
Immature Granulocytes: 0 %
Lymphocytes Absolute: 2.3 x10E3/uL (ref 0.7–3.1)
Lymphs: 23 %
MCH: 31.1 pg (ref 26.6–33.0)
MCHC: 32.9 g/dL (ref 31.5–35.7)
MCV: 95 fL (ref 79–97)
Monocytes Absolute: 0.9 x10E3/uL (ref 0.1–0.9)
Monocytes: 9 %
Neutrophils Absolute: 6.7 x10E3/uL (ref 1.4–7.0)
Neutrophils: 66 %
Platelets: 234 x10E3/uL (ref 150–450)
RBC: 4.6 x10E6/uL (ref 3.77–5.28)
RDW: 12.7 % (ref 11.7–15.4)
WBC: 10.1 x10E3/uL (ref 3.4–10.8)

## 2024-01-20 LAB — CMP14+EGFR
ALT: 7 IU/L (ref 0–32)
AST: 14 IU/L (ref 0–40)
Albumin: 4.3 g/dL (ref 3.7–4.7)
Alkaline Phosphatase: 97 IU/L (ref 48–129)
BUN/Creatinine Ratio: 11 — ABNORMAL LOW (ref 12–28)
BUN: 16 mg/dL (ref 8–27)
Bilirubin Total: 0.6 mg/dL (ref 0.0–1.2)
CO2: 27 mmol/L (ref 20–29)
Calcium: 9.4 mg/dL (ref 8.7–10.3)
Chloride: 98 mmol/L (ref 96–106)
Creatinine, Ser: 1.45 mg/dL — ABNORMAL HIGH (ref 0.57–1.00)
Globulin, Total: 2.8 g/dL (ref 1.5–4.5)
Glucose: 94 mg/dL (ref 70–99)
Potassium: 4.5 mmol/L (ref 3.5–5.2)
Sodium: 137 mmol/L (ref 134–144)
Total Protein: 7.1 g/dL (ref 6.0–8.5)
eGFR: 35 mL/min/1.73 — ABNORMAL LOW (ref >59)

## 2024-01-20 LAB — LIPID PANEL
Chol/HDL Ratio: 3.4 ratio (ref 0.0–4.4)
Cholesterol, Total: 159 mg/dL (ref 100–199)
HDL: 47 mg/dL (ref >39)
LDL Chol Calc (NIH): 89 mg/dL (ref 0–99)
Triglycerides: 128 mg/dL (ref 0–149)
VLDL Cholesterol Cal: 23 mg/dL (ref 5–40)

## 2024-01-20 LAB — THYROID PANEL WITH TSH
Free Thyroxine Index: 2.6 (ref 1.2–4.9)
T3 Uptake Ratio: 26 % (ref 24–39)
T4, Total: 9.9 ug/dL (ref 4.5–12.0)
TSH: 1.32 u[IU]/mL (ref 0.450–4.500)

## 2024-02-20 ENCOUNTER — Ambulatory Visit (INDEPENDENT_AMBULATORY_CARE_PROVIDER_SITE_OTHER): Payer: Self-pay | Admitting: *Deleted

## 2024-02-20 DIAGNOSIS — E538 Deficiency of other specified B group vitamins: Secondary | ICD-10-CM | POA: Diagnosis not present

## 2024-02-20 NOTE — Progress Notes (Signed)
 Patient is in office today for a nurse visit for B12 Injection. Injection was given in the  Left deltoid. Patient tolerated injection well.

## 2024-03-09 ENCOUNTER — Other Ambulatory Visit: Payer: Self-pay | Admitting: Nurse Practitioner

## 2024-03-09 DIAGNOSIS — I10 Essential (primary) hypertension: Secondary | ICD-10-CM

## 2024-03-23 ENCOUNTER — Ambulatory Visit (INDEPENDENT_AMBULATORY_CARE_PROVIDER_SITE_OTHER): Admitting: *Deleted

## 2024-03-23 DIAGNOSIS — E538 Deficiency of other specified B group vitamins: Secondary | ICD-10-CM

## 2024-03-23 NOTE — Progress Notes (Signed)
 Patient is in office today for a nurse visit for B12 Injection. Patient Injection was given in the  Right deltoid. Patient tolerated injection well.

## 2024-04-23 ENCOUNTER — Ambulatory Visit

## 2024-04-23 DIAGNOSIS — E538 Deficiency of other specified B group vitamins: Secondary | ICD-10-CM | POA: Diagnosis not present

## 2024-04-23 NOTE — Progress Notes (Signed)
 Patient is in office today for a nurse visit for B12 Injection. Patient Injection was given in the  Left deltoid. Patient tolerated injection well.

## 2024-04-28 ENCOUNTER — Ambulatory Visit: Payer: Medicare HMO

## 2024-04-28 VITALS — BP 135/78 | HR 60 | Ht 63.0 in | Wt 147.0 lb

## 2024-04-28 DIAGNOSIS — Z0000 Encounter for general adult medical examination without abnormal findings: Secondary | ICD-10-CM | POA: Diagnosis not present

## 2024-04-28 NOTE — Patient Instructions (Signed)
 Denise Stewart,  Thank you for taking the time for your Medicare Wellness Visit. I appreciate your continued commitment to your health goals. Please review the care plan we discussed, and feel free to reach out if I can assist you further.  Please note that Annual Wellness Visits do not include a physical exam. Some assessments may be limited, especially if the visit was conducted virtually. If needed, we may recommend an in-person follow-up with your provider.  Ongoing Care Seeing your primary care provider every 3 to 6 months helps us  monitor your health and provide consistent, personalized care.   Referrals If a referral was made during today's visit and you haven't received any updates within two weeks, please contact the referred provider directly to check on the status.  Recommended Screenings:  Health Maintenance  Topic Date Due   Zoster (Shingles) Vaccine (1 of 2) Never done   COVID-19 Vaccine (1 - 2025-26 season) Never done   Medicare Annual Wellness Visit  04/27/2024   Flu Shot  07/06/2024*   DTaP/Tdap/Td vaccine (1 - Tdap) 07/13/2024*   Osteoporosis screening with Bone Density Scan  01/18/2025   Pneumococcal Vaccine for age over 38  Completed   Meningitis B Vaccine  Aged Out  *Topic was postponed. The date shown is not the original due date.       04/28/2024   12:58 PM  Advanced Directives  Does Patient Have a Medical Advance Directive? No  Would patient like information on creating a medical advance directive? No - Patient declined    Vision: Annual vision screenings are recommended for early detection of glaucoma, cataracts, and diabetic retinopathy. These exams can also reveal signs of chronic conditions such as diabetes and high blood pressure.  Dental: Annual dental screenings help detect early signs of oral cancer, gum disease, and other conditions linked to overall health, including heart disease and diabetes.  Please see the attached documents for additional  preventive care recommendations.

## 2024-04-28 NOTE — Progress Notes (Signed)
 "  Chief Complaint  Patient presents with   Medicare Wellness     Subjective:   Denise Stewart is a 84 y.o. female who presents for a Medicare Annual Wellness Visit.  Visit info / Clinical Intake: Medicare Wellness Visit Type:: Subsequent Annual Wellness Visit Persons participating in visit and providing information:: patient Medicare Wellness Visit Mode:: Telephone If telephone:: video declined Since this visit was completed virtually, some vitals may be partially provided or unavailable. Missing vitals are due to the limitations of the virtual format.: Unable to obtain vitals - no equipment If Telephone or Video please confirm:: I connected with patient using audio/video enable telemedicine. I verified patient identity with two identifiers, discussed telehealth limitations, and patient agreed to proceed. Patient Location:: home Provider Location:: office Interpreter Needed?: No Pre-visit prep was completed: yes AWV questionnaire completed by patient prior to visit?: no Living arrangements:: (!) lives alone Patient's Overall Health Status Rating: very good Typical amount of pain: none Does pain affect daily life?: no Are you currently prescribed opioids?: no  Dietary Habits and Nutritional Risks How many meals a day?: 3 Eats fruit and vegetables daily?: yes Most meals are obtained by: preparing own meals In the last 2 weeks, have you had any of the following?: none Diabetic:: no  Functional Status Activities of Daily Living (to include ambulation/medication): Independent Ambulation: Independent with device- listed below Home Assistive Devices/Equipment: Cane Medication Administration: Independent Home Management (perform basic housework or laundry): Independent Manage your own finances?: yes Primary transportation is: family / friends (pts daughter) Concerns about vision?: no *vision screening is required for WTM* (last ov a couple yrs ago) Concerns about hearing?:  no  Fall Screening Falls in the past year?: 1 Number of falls in past year: 0 Was there an injury with Fall?: 1 Fall Risk Category Calculator: 2 Patient Fall Risk Level: Moderate Fall Risk  Fall Risk Patient at Risk for Falls Due to: Impaired balance/gait; Impaired mobility Fall risk Follow up: Falls evaluation completed; Education provided  Home and Transportation Safety: All rugs have non-skid backing?: yes All stairs or steps have railings?: N/A, no stairs Grab bars in the bathtub or shower?: yes Have non-skid surface in bathtub or shower?: yes Good home lighting?: yes Regular seat belt use?: yes Hospital stays in the last year:: no  Cognitive Assessment Difficulty concentrating, remembering, or making decisions? : no Will 6CIT or Mini Cog be Completed: yes What year is it?: 0 points What month is it?: 0 points Give patient an address phrase to remember (5 components): [address] [place] About what time is it?: 0 points Count backwards from 20 to 1: 0 points Say the months of the year in reverse: 0 points Repeat the address phrase from earlier: 4 points 6 CIT Score: 4 points  Advance Directives (For Healthcare) Does Patient Have a Medical Advance Directive?: No Would patient like information on creating a medical advance directive?: No - Patient declined  Reviewed/Updated  Reviewed/Updated: Reviewed All (Medical, Surgical, Family, Medications, Allergies, Care Teams, Patient Goals); Medical History; Surgical History; Family History; Medications; Allergies; Care Teams; Patient Goals    Allergies (verified) Celexa  [citalopram  hydrobromide], Zocor [simvastatin], and Pravachol [pravastatin sodium]   Current Medications (verified) Outpatient Encounter Medications as of 04/28/2024  Medication Sig   alendronate  (FOSAMAX ) 70 MG tablet Take 1 tablet (70 mg total) by mouth once a week. Take with a full glass of water on an empty stomach.   amLODipine  (NORVASC ) 10  MG tablet Take 1 tablet (  10 mg total) by mouth daily.   aspirin 81 MG tablet Take 81 mg by mouth daily.   calcium carbonate (OS-CAL) 600 MG TABS Take 600 mg by mouth 2 (two) times daily with a meal.   Cholecalciferol (VITAMIN D -3) 1000 UNITS CAPS Take 1,000 Units by mouth daily.   citalopram  (CELEXA ) 20 MG tablet Take 1 tablet (20 mg total) by mouth daily.   clonazePAM  (KLONOPIN ) 0.5 MG tablet TAKE ONE (1) TABLET BY MOUTH TWO (2) TIMES DAILY   fish oil-omega-3 fatty acids 1000 MG capsule Take 2 g by mouth daily.   furosemide  (LASIX ) 20 MG tablet Take 1 tablet (20 mg total) by mouth daily.   gabapentin  (NEURONTIN ) 100 MG capsule Take 1 capsule (100 mg total) by mouth 3 (three) times daily.   levothyroxine  (SYNTHROID ) 88 MCG tablet Take 1 tablet (88 mcg total) by mouth daily with breakfast.   lisinopril  (ZESTRIL ) 40 MG tablet Take 1 tablet (40 mg total) by mouth daily.   metoprolol  succinate (TOPROL -XL) 100 MG 24 hr tablet Take 1 tablet (100 mg total) by mouth daily. with food   Facility-Administered Encounter Medications as of 04/28/2024  Medication   cyanocobalamin  (VITAMIN B12) injection 1,000 mcg    History: Past Medical History:  Diagnosis Date   Depression    GAD (generalized anxiety disorder)    Hyperlipidemia    Hypertension    Thyroid  disease    Past Surgical History:  Procedure Laterality Date   EYE SURGERY Right 1970   right -UNDER eye lesion removed    Family History  Problem Relation Age of Onset   Hypertension Mother    Heart attack Mother 39   Heart disease Mother    Clotting disorder Father 22   Stroke Brother    Suicidality Sister    Other Sister        train accident   Cancer Sister        liver cancer   Diabetes Brother    Other Brother        storm accident   Diabetes Brother        leg removed    Cancer Brother        lung   Cancer Brother        lymphoma   Social History   Occupational History   Occupation: retired    Associate Professor: [employer]  Tobacco Use   Smoking status: Every Day    Current packs/day: 0.50    Average packs/day: 0.5 packs/day for 71.1 years (35.5 ttl pk-yrs)    Types: Cigarettes    Start date: 04/08/1953   Smokeless tobacco: Never  Vaping Use   Vaping status: Never Used  Substance and Sexual Activity   Alcohol use: No   Drug use: No   Sexual activity: Not Currently    Birth control/protection: Post-menopausal   Tobacco Counseling Ready to quit: Not Answered Counseling given: Yes  SDOH Screenings   Food Insecurity: No Food Insecurity (04/28/2024)  Housing: Unknown (04/28/2024)  Transportation Needs: No Transportation Needs (04/28/2024)  Utilities: Not At Risk (04/28/2024)  Alcohol Screen: Low Risk (04/28/2023)  Depression (PHQ2-9): Low Risk (04/28/2024)  Financial Resource Strain: Low Risk (04/28/2023)  Physical Activity: Insufficiently Active (04/28/2024)  Social Connections: Moderately Isolated (04/28/2024)  Stress: No Stress Concern Present (04/28/2024)  Tobacco Use: High Risk (04/28/2024)  Health Literacy: Adequate Health Literacy (04/28/2024)   See flowsheets for full screening details  Depression Screen PHQ 2 & 9 Depression Scale- Over the past  2 weeks, how often have you been bothered by any of the following problems? Little interest or pleasure in doing things: 0 Feeling down, depressed, or hopeless (PHQ Adolescent also includes...irritable): 1 PHQ-2 Total Score: 1 Trouble falling or staying asleep, or sleeping too much: 0 Feeling tired or having little energy: 0 Poor appetite or overeating (PHQ Adolescent also includes...weight loss): 0 Feeling bad about yourself - or that you are a failure or have let yourself or your family down: 0 Trouble concentrating on things, such as reading the newspaper or watching television (PHQ Adolescent also includes...like school work): 0 Moving or speaking so slowly that other people could have noticed. Or the opposite - being so fidgety or restless  that you have been moving around a lot more than usual: 0 Thoughts that you would be better off dead, or of hurting yourself in some way: 0 PHQ-9 Total Score: 0 If you checked off any problems, how difficult have these problems made it for you to do your work, take care of things at home, or get along with other people?: Not difficult at all     Goals Addressed               This Visit's Progress     Increase physical activity (pt-stated)   On track     Been doing some leg stretches/walk around the house as much as she can             Objective:    There were no vitals filed for this visit. There is no height or weight on file to calculate BMI.  Hearing/Vision screen No results found. Immunizations and Health Maintenance Health Maintenance  Topic Date Due   Zoster Vaccines- Shingrix (1 of 2) Never done   COVID-19 Vaccine (1 - 2025-26 season) Never done   Influenza Vaccine  07/06/2024 (Originally 11/07/2023)   DTaP/Tdap/Td (1 - Tdap) 07/13/2024 (Originally 09/19/1959)   Bone Density Scan  01/18/2025   Medicare Annual Wellness (AWV)  04/28/2025   Pneumococcal Vaccine: 50+ Years  Completed   Meningococcal B Vaccine  Aged Out        Assessment/Plan:  This is a routine wellness examination for Denise Stewart.  Patient Care Team: Gladis Mustard, FNP as PCP - General (Nurse Practitioner) Ladora Ross Lacy Phebe, MD as Referring Physician [facility])  I have personally reviewed and noted the following in the patients chart:   Medical and social history Use of alcohol, tobacco or illicit drugs  Current medications and supplements including opioid prescriptions. Functional ability and status Nutritional status Physical activity Advanced directives List of other physicians Hospitalizations, surgeries, and ER visits in previous 12 months Vitals Screenings to include cognitive, depression, and falls Referrals and appointments  No orders of the defined types were placed in  this encounter.  In addition, I have reviewed and discussed with patient certain preventive protocols, quality metrics, and best practice recommendations. A written personalized care plan for preventive services as well as general preventive health recommendations were provided to patient.   Denise Stewart, CMA   04/28/2024   Return in 1 year (on 04/28/2025).  After Visit Summary: (MyChart) Due to this being a telephonic visit, the after visit summary with patients personalized plan was offered to patient via MyChart   Nurse Notes: Pt is aware and due the following: shingles, covid, and flu vaccines. Pt is aware to get them at the pharmacy "

## 2024-05-24 ENCOUNTER — Ambulatory Visit

## 2024-07-16 ENCOUNTER — Ambulatory Visit: Payer: Self-pay | Admitting: Nurse Practitioner

## 2025-04-29 ENCOUNTER — Ambulatory Visit
